# Patient Record
Sex: Female | Born: 1950 | Race: Black or African American | Hispanic: No | Marital: Married | Smoking: Never smoker
Health system: Southern US, Community
[De-identification: ages and names within clinical notes are randomized; demographics above are authoritative.]

## PROBLEM LIST (undated history)

## (undated) DIAGNOSIS — M2540 Effusion, unspecified joint: Secondary | ICD-10-CM

## (undated) DIAGNOSIS — M069 Rheumatoid arthritis, unspecified: Secondary | ICD-10-CM

## (undated) DIAGNOSIS — I10 Essential (primary) hypertension: Secondary | ICD-10-CM

## (undated) DIAGNOSIS — G8929 Other chronic pain: Secondary | ICD-10-CM

## (undated) DIAGNOSIS — M2550 Pain in unspecified joint: Secondary | ICD-10-CM

## (undated) DIAGNOSIS — M1990 Unspecified osteoarthritis, unspecified site: Secondary | ICD-10-CM

## (undated) DIAGNOSIS — M549 Dorsalgia, unspecified: Secondary | ICD-10-CM

## (undated) HISTORY — PX: HERNIA REPAIR: SHX51

## (undated) HISTORY — PX: ABDOMINAL HYSTERECTOMY: SHX81

## (undated) HISTORY — PX: JOINT REPLACEMENT: SHX530

## (undated) HISTORY — PX: COLONOSCOPY: SHX174

## (undated) HISTORY — PX: BACK SURGERY: SHX140

## (undated) HISTORY — PX: TONSILLECTOMY: SUR1361

---

## 2000-07-16 ENCOUNTER — Encounter: Admission: RE | Admit: 2000-07-16 | Discharge: 2000-07-16 | Payer: Self-pay | Admitting: Family Medicine

## 2000-07-16 ENCOUNTER — Encounter: Payer: Self-pay | Admitting: Family Medicine

## 2001-06-11 ENCOUNTER — Other Ambulatory Visit: Admission: RE | Admit: 2001-06-11 | Discharge: 2001-06-11 | Payer: Self-pay | Admitting: Family Medicine

## 2002-01-27 ENCOUNTER — Encounter: Admission: RE | Admit: 2002-01-27 | Discharge: 2002-01-27 | Payer: Self-pay | Admitting: Family Medicine

## 2002-01-27 ENCOUNTER — Encounter: Payer: Self-pay | Admitting: Family Medicine

## 2002-02-01 ENCOUNTER — Encounter: Payer: Self-pay | Admitting: Family Medicine

## 2002-02-01 ENCOUNTER — Encounter: Admission: RE | Admit: 2002-02-01 | Discharge: 2002-02-01 | Payer: Self-pay | Admitting: Family Medicine

## 2002-02-04 ENCOUNTER — Encounter: Payer: Self-pay | Admitting: Family Medicine

## 2002-02-04 ENCOUNTER — Encounter: Admission: RE | Admit: 2002-02-04 | Discharge: 2002-02-04 | Payer: Self-pay | Admitting: Family Medicine

## 2003-02-15 ENCOUNTER — Encounter: Admission: RE | Admit: 2003-02-15 | Discharge: 2003-02-15 | Payer: Self-pay | Admitting: Family Medicine

## 2003-02-15 ENCOUNTER — Encounter: Payer: Self-pay | Admitting: Family Medicine

## 2004-03-14 ENCOUNTER — Encounter: Admission: RE | Admit: 2004-03-14 | Discharge: 2004-03-14 | Payer: Self-pay | Admitting: Family Medicine

## 2004-07-18 ENCOUNTER — Ambulatory Visit (HOSPITAL_COMMUNITY): Admission: RE | Admit: 2004-07-18 | Discharge: 2004-07-18 | Payer: Self-pay | Admitting: Gastroenterology

## 2005-07-30 ENCOUNTER — Ambulatory Visit (HOSPITAL_COMMUNITY): Admission: RE | Admit: 2005-07-30 | Discharge: 2005-07-30 | Payer: Self-pay | Admitting: Family Medicine

## 2006-04-21 ENCOUNTER — Ambulatory Visit (HOSPITAL_BASED_OUTPATIENT_CLINIC_OR_DEPARTMENT_OTHER): Admission: RE | Admit: 2006-04-21 | Discharge: 2006-04-21 | Payer: Self-pay | Admitting: Orthopedic Surgery

## 2006-08-07 ENCOUNTER — Ambulatory Visit (HOSPITAL_COMMUNITY): Admission: RE | Admit: 2006-08-07 | Discharge: 2006-08-07 | Payer: Self-pay | Admitting: Family Medicine

## 2007-09-30 ENCOUNTER — Ambulatory Visit (HOSPITAL_COMMUNITY): Admission: RE | Admit: 2007-09-30 | Discharge: 2007-09-30 | Payer: Self-pay | Admitting: Family Medicine

## 2008-09-25 ENCOUNTER — Ambulatory Visit: Payer: Self-pay | Admitting: Internal Medicine

## 2008-10-03 LAB — CBC WITH DIFFERENTIAL/PLATELET
BASO%: 1.1 % (ref 0.0–2.0)
Basophils Absolute: 0 10*3/uL (ref 0.0–0.1)
EOS%: 0.8 % (ref 0.0–7.0)
HGB: 12.1 g/dL (ref 11.6–15.9)
MCH: 30.2 pg (ref 26.0–34.0)
MCHC: 33.9 g/dL (ref 32.0–36.0)
MCV: 89 fL (ref 81.0–101.0)
MONO%: 26.2 % — ABNORMAL HIGH (ref 0.0–13.0)
RBC: 4.03 10*6/uL (ref 3.70–5.32)
RDW: 13.7 % (ref 11.3–14.5)

## 2008-10-03 LAB — COMPREHENSIVE METABOLIC PANEL
AST: 22 U/L (ref 0–37)
Albumin: 4.3 g/dL (ref 3.5–5.2)
Alkaline Phosphatase: 78 U/L (ref 39–117)
BUN: 9 mg/dL (ref 6–23)
Potassium: 3.2 mEq/L — ABNORMAL LOW (ref 3.5–5.3)

## 2008-10-12 ENCOUNTER — Ambulatory Visit (HOSPITAL_COMMUNITY): Admission: RE | Admit: 2008-10-12 | Discharge: 2008-10-12 | Payer: Self-pay | Admitting: Family Medicine

## 2008-10-25 ENCOUNTER — Ambulatory Visit: Payer: Self-pay | Admitting: Internal Medicine

## 2008-10-27 ENCOUNTER — Encounter: Payer: Self-pay | Admitting: Internal Medicine

## 2008-10-27 ENCOUNTER — Ambulatory Visit (HOSPITAL_COMMUNITY): Admission: RE | Admit: 2008-10-27 | Discharge: 2008-10-27 | Payer: Self-pay | Admitting: Internal Medicine

## 2008-10-30 ENCOUNTER — Ambulatory Visit: Payer: Self-pay | Admitting: Internal Medicine

## 2009-07-25 ENCOUNTER — Emergency Department (HOSPITAL_COMMUNITY): Admission: EM | Admit: 2009-07-25 | Discharge: 2009-07-25 | Payer: Self-pay | Admitting: Emergency Medicine

## 2009-10-15 ENCOUNTER — Ambulatory Visit (HOSPITAL_COMMUNITY): Admission: RE | Admit: 2009-10-15 | Discharge: 2009-10-15 | Payer: Self-pay | Admitting: Family Medicine

## 2009-10-19 ENCOUNTER — Encounter: Admission: RE | Admit: 2009-10-19 | Discharge: 2009-10-19 | Payer: Self-pay | Admitting: Family Medicine

## 2010-10-21 ENCOUNTER — Ambulatory Visit (HOSPITAL_COMMUNITY)
Admission: RE | Admit: 2010-10-21 | Discharge: 2010-10-21 | Payer: Self-pay | Source: Home / Self Care | Attending: Family Medicine | Admitting: Family Medicine

## 2010-11-02 ENCOUNTER — Encounter: Payer: Self-pay | Admitting: Family Medicine

## 2010-11-03 ENCOUNTER — Encounter: Payer: Self-pay | Admitting: Family Medicine

## 2010-12-17 ENCOUNTER — Other Ambulatory Visit: Payer: Self-pay | Admitting: Rheumatology

## 2010-12-18 ENCOUNTER — Ambulatory Visit
Admission: RE | Admit: 2010-12-18 | Discharge: 2010-12-18 | Disposition: A | Discharge status: Home / Self Care | Payer: BC Managed Care – PPO | Source: Ambulatory Visit | Attending: Rheumatology | Admitting: Rheumatology

## 2011-01-27 LAB — CBC
MCHC: 33.1 g/dL (ref 30.0–36.0)
RDW: 13 % (ref 11.5–15.5)

## 2011-01-27 LAB — DIFFERENTIAL
Basophils Absolute: 0 10*3/uL (ref 0.0–0.1)
Eosinophils Absolute: 0 10*3/uL (ref 0.0–0.7)
Lymphocytes Relative: 42 % (ref 12–46)
Neutrophils Relative %: 38 % — ABNORMAL LOW (ref 43–77)

## 2011-01-27 LAB — BONE MARROW EXAM

## 2011-02-28 NOTE — Op Note (Signed)
NAMECHUDNEY, SCHEFFLER                ACCOUNT NO.:  [id number]   MEDICAL RECORD NO.:  [id number]          PATIENT TYPE:  AMB   LOCATION:  ENDO                         FACILITY:  [facility]   PHYSICIAN:  Danise Edge, M.D.   DATE OF BIRTH:  08-18-1951   DATE OF PROCEDURE:  07/18/2004  DATE OF DISCHARGE:                                 OPERATIVE REPORT   REFERRING PHYSICIAN:  Dr. Donia Guiles   PROCEDURE:  Screening colonoscopy.   PROCEDURE INDICATION:  Ms. Cassandra Case is a 60 year old female, born  [birth year].  Ms. Dacquisto is scheduled to undergo her first screening  colonoscopy with polypectomy to prevent colon cancer.   ENDOSCOPIST:  Danise Edge, M.D.   PREMEDICATION:  1.  Versed 6 mg.  2.  Demerol 60 mg.   DESCRIPTION OF PROCEDURE:  After obtaining informed consent, Ms. Fike was  placed in the left lateral decubitus position.  I administered intravenous  Demerol and intravenous Versed to achieve conscious sedation for the  procedure.  The patient's blood pressure, oxygen saturation, and cardiac  rhythm were monitored throughout the procedure and documented in the medical  record.   Anal inspection and digital rectal exam were normal.  The Olympus adjustable  pediatric colonoscope was introduced into the rectum and advanced to the  cecum.  Colonic preparation for the exam today was excellent.   RECTUM:  Normal.  SIGMOID COLON AND DESCENDING COLON:  Normal.  SPLENIC FLEXURE:  Normal.  TRANSVERSE COLON:  Normal.  HEPATIC FLEXURE:  Normal.  ASCENDING COLON:  Normal.  CECUM AND ILEOCECAL VALVE:  Normal.   ASSESSMENT:  Normal screening proctocolonoscopy to the cecum.      MJ/MEDQ  D:  07/18/2004  T:  07/18/2004  Job:  [id number]   cc:   Donia Guiles, M.D.  [address] [place]  [place] [address]  Fax: [phone]

## 2011-02-28 NOTE — Op Note (Signed)
NAMEANTHONIA, Cassandra Case                ACCOUNT NO.:  [id number]   MEDICAL RECORD NO.:  [id number]          PATIENT TYPE:  AMB   LOCATION:  DSC                          FACILITY:  [facility]   PHYSICIAN:  Katy Fitch. Sypher, M.D. DATE OF BIRTH:  1951-04-11   DATE OF PROCEDURE:  04/21/2006  DATE OF DISCHARGE:                                 OPERATIVE REPORT   PREOPERATIVE DIAGNOSIS:  Entrapment neuropathy, median nerve, right carpal  tunnel.   POSTOPERATIVE DIAGNOSIS:  Entrapment neuropathy, median nerve, right carpal  tunnel.   OPERATIONS:  Release of right transverse carpal ligament.   OPERATIONS:  Katy Fitch. Sypher, MD   ASSISTANT:  Annye Rusk PA-C.   ANESTHESIA:  General by LMA.   SUPERVISING ANESTHESIOLOGIST:  Dr. Gelene Mink.   INDICATIONS:  [name] is a 60 year old woman who is referred for  evaluation and management of an comfortable and numb right hand.  Clinical  examination revealed signs of carpal tunnel syndrome.  Electrodiagnostic  completed by Dr. __________ revealed moderately severe right carpal tunnel  syndrome.   Due to a failure to respond to nonoperative measures, she is brought to the  operating room at this time for release of her right tranverse carpal  ligament.   PROCEDURE:  [name] is brought to the operating room and placed in the  supine position on the operating table.   Following the induction of general anesthesia by LMA technique, the right  arm was prepped with Betadine soap solution and sterilely draped.  A  pneumatic tourniquet was applied to the proximal right brachium.   Following exsanguination of the right arm an Esmarch bandage, arterial  tourniquet was inflated to 220 mmHg.  The procedure commenced with short  incision in the line of the ring finger in the palm.  Subcutaneous tissues  were carefully divided revealing the palmar fascia.  This was split  longitudinally to reveal the common sensory branch the median nerve and the  superficial palmar arch.   The plane between the median nerve and flexor tendons and the tranverse  carpal ligament was developed with a Penfield 4 elevator followed by  scissors release of the tranverse carpal ligament along its ulnar border.  The volar carpal ligament was released subcutaneously into the distal  forearm.   This widely opened the carpal canal.  No mass or other predicaments were  noted.   Bleeding points along the margin of the loose ligament were  electrocauterized with bipolar current followed by repair of the skin with  intradermal 3-0 Prolene suture.   A compressive dressing was applied with a volar plaster splint keeping the  hand in 5 degrees of dorsiflexion.      Katy Fitch Sypher, M.D.  Electronically Signed     RVS/MEDQ  D:  04/21/2006  T:  04/21/2006  Job:  [id number]

## 2011-10-24 ENCOUNTER — Other Ambulatory Visit (HOSPITAL_COMMUNITY): Payer: Self-pay | Admitting: Family Medicine

## 2011-10-24 DIAGNOSIS — Z1231 Encounter for screening mammogram for malignant neoplasm of breast: Secondary | ICD-10-CM

## 2011-11-21 ENCOUNTER — Ambulatory Visit (HOSPITAL_COMMUNITY)
Admission: RE | Admit: 2011-11-21 | Discharge: 2011-11-21 | Disposition: A | Discharge status: Home / Self Care | Payer: BC Managed Care – PPO | Source: Ambulatory Visit | Attending: Family Medicine | Admitting: Family Medicine

## 2011-11-21 DIAGNOSIS — Z1231 Encounter for screening mammogram for malignant neoplasm of breast: Secondary | ICD-10-CM

## 2011-11-24 ENCOUNTER — Other Ambulatory Visit: Payer: Self-pay | Admitting: Rheumatology

## 2011-11-24 ENCOUNTER — Ambulatory Visit
Admission: RE | Admit: 2011-11-24 | Discharge: 2011-11-24 | Disposition: A | Discharge status: Home / Self Care | Payer: BC Managed Care – PPO | Source: Ambulatory Visit | Attending: Rheumatology | Admitting: Rheumatology

## 2011-11-24 DIAGNOSIS — R1030 Lower abdominal pain, unspecified: Secondary | ICD-10-CM

## 2012-01-02 ENCOUNTER — Encounter: Payer: Self-pay | Admitting: Family Medicine

## 2012-01-02 ENCOUNTER — Ambulatory Visit (INDEPENDENT_AMBULATORY_CARE_PROVIDER_SITE_OTHER): Payer: BC Managed Care – PPO | Admitting: Family Medicine

## 2012-01-02 VITALS — BP 122/69 | HR 72 | Temp 98.1°F | Resp 16 | Ht 64.75 in | Wt 150.8 lb

## 2012-01-02 DIAGNOSIS — J329 Chronic sinusitis, unspecified: Secondary | ICD-10-CM

## 2012-01-02 DIAGNOSIS — J019 Acute sinusitis, unspecified: Secondary | ICD-10-CM

## 2012-01-02 MED ORDER — AMOXICILLIN 875 MG PO TABS: 875.0000 mg | ORAL_TABLET | Freq: Two times a day (BID) | ORAL | Status: AC

## 2012-01-02 MED ORDER — HYDROCODONE-HOMATROPINE 5-1.5 MG/5ML PO SYRP: 5.0000 mL | ORAL_SOLUTION | Freq: Three times a day (TID) | ORAL | Status: AC | PRN

## 2012-01-02 NOTE — Progress Notes (Signed)
This is a 61 year old woman from the [place] whose married with grown children. He comes in with 1 week of progressive sinus congestion, myalgias, headache, and fatigue. She's also been coughing and she in general feels terrible. Her husband is also here with the same symptoms.  Objective: Patient appears acutely ill with no respiratory distress  HEENT: Unremarkable except for mucopurulent discharge from both nasal passages  Eyes: No icterus EOMs normal  Neck: Supple no adenopathy no thyromegaly  Chest: Rhonchi, scattered and clear with cough   Heart: Regular no murmur  Extremities: No edema  Skin: Warm and dry  assessment: Acute sinusitis, worsening  Plan he is amoxicillin and Hydromet. Patient to call if not better in 48 hours

## 2012-01-02 NOTE — Patient Instructions (Signed)

## 2012-02-13 ENCOUNTER — Other Ambulatory Visit: Payer: Self-pay | Admitting: Rheumatology

## 2012-02-13 DIAGNOSIS — M545 Low back pain: Secondary | ICD-10-CM

## 2012-02-19 ENCOUNTER — Ambulatory Visit
Admission: RE | Admit: 2012-02-19 | Discharge: 2012-02-19 | Disposition: A | Discharge status: Home / Self Care | Payer: BC Managed Care – PPO | Source: Ambulatory Visit | Attending: Rheumatology | Admitting: Rheumatology

## 2012-02-19 DIAGNOSIS — M545 Low back pain: Secondary | ICD-10-CM

## 2012-02-24 ENCOUNTER — Other Ambulatory Visit: Payer: Self-pay | Admitting: Rheumatology

## 2012-02-24 DIAGNOSIS — M5410 Radiculopathy, site unspecified: Secondary | ICD-10-CM

## 2012-03-04 ENCOUNTER — Ambulatory Visit
Admission: RE | Admit: 2012-03-04 | Discharge: 2012-03-04 | Disposition: A | Discharge status: Home / Self Care | Payer: BC Managed Care – PPO | Source: Ambulatory Visit | Attending: Rheumatology | Admitting: Rheumatology

## 2012-03-04 VITALS — BP 158/78 | HR 66

## 2012-03-04 DIAGNOSIS — M5410 Radiculopathy, site unspecified: Secondary | ICD-10-CM

## 2012-03-04 MED ORDER — METHYLPREDNISOLONE ACETATE 40 MG/ML INJ SUSP (RADIOLOG
120.0000 mg | Freq: Once | INTRAMUSCULAR | Status: AC
  Administered 2012-03-04: 120 mg via EPIDURAL

## 2012-03-04 MED ORDER — DIAZEPAM 5 MG PO TABS
5.0000 mg | ORAL_TABLET | Freq: Once | ORAL | Status: AC
  Administered 2012-03-04: 5 mg via ORAL

## 2012-03-04 MED ORDER — IOHEXOL 180 MG/ML  SOLN
1.0000 mL | Freq: Once | INTRAMUSCULAR | Status: AC | PRN
  Administered 2012-03-04: 1 mL via EPIDURAL

## 2012-03-04 NOTE — Progress Notes (Signed)
Pt very nervous about epidural steroid injection and valium 5 mg given. Pt is only on BP meds at home.

## 2012-03-04 NOTE — Discharge Instructions (Signed)

## 2012-04-28 ENCOUNTER — Other Ambulatory Visit: Payer: Self-pay | Admitting: Rheumatology

## 2012-04-28 DIAGNOSIS — M5410 Radiculopathy, site unspecified: Secondary | ICD-10-CM

## 2012-04-30 ENCOUNTER — Ambulatory Visit
Admission: RE | Admit: 2012-04-30 | Discharge: 2012-04-30 | Disposition: A | Discharge status: Home / Self Care | Payer: BC Managed Care – PPO | Source: Ambulatory Visit | Attending: Rheumatology | Admitting: Rheumatology

## 2012-04-30 VITALS — BP 132/65 | HR 68

## 2012-04-30 DIAGNOSIS — M5410 Radiculopathy, site unspecified: Secondary | ICD-10-CM

## 2012-04-30 MED ORDER — IOHEXOL 180 MG/ML  SOLN
1.0000 mL | Freq: Once | INTRAMUSCULAR | Status: AC | PRN
  Administered 2012-04-30: 1 mL via EPIDURAL

## 2012-04-30 MED ORDER — METHYLPREDNISOLONE ACETATE 40 MG/ML INJ SUSP (RADIOLOG
120.0000 mg | Freq: Once | INTRAMUSCULAR | Status: AC
  Administered 2012-04-30: 120 mg via EPIDURAL

## 2012-07-14 ENCOUNTER — Ambulatory Visit
Admission: RE | Admit: 2012-07-14 | Discharge: 2012-07-14 | Disposition: A | Discharge status: Home / Self Care | Payer: BC Managed Care – PPO | Source: Ambulatory Visit | Attending: Rheumatology | Admitting: Rheumatology

## 2012-07-14 ENCOUNTER — Other Ambulatory Visit: Payer: Self-pay | Admitting: Rheumatology

## 2012-07-14 DIAGNOSIS — R1030 Lower abdominal pain, unspecified: Secondary | ICD-10-CM

## 2012-07-14 DIAGNOSIS — M79659 Pain in unspecified thigh: Secondary | ICD-10-CM

## 2012-07-16 ENCOUNTER — Other Ambulatory Visit: Payer: Self-pay | Admitting: Rheumatology

## 2012-07-16 DIAGNOSIS — M25552 Pain in left hip: Secondary | ICD-10-CM

## 2012-07-16 DIAGNOSIS — M8700 Idiopathic aseptic necrosis of unspecified bone: Secondary | ICD-10-CM

## 2012-07-18 ENCOUNTER — Ambulatory Visit
Admission: RE | Admit: 2012-07-18 | Discharge: 2012-07-18 | Disposition: A | Discharge status: Home / Self Care | Payer: BC Managed Care – PPO | Source: Ambulatory Visit | Attending: Rheumatology | Admitting: Rheumatology

## 2012-07-18 DIAGNOSIS — M25552 Pain in left hip: Secondary | ICD-10-CM

## 2012-07-18 DIAGNOSIS — M8700 Idiopathic aseptic necrosis of unspecified bone: Secondary | ICD-10-CM

## 2012-07-28 ENCOUNTER — Other Ambulatory Visit: Payer: Self-pay | Admitting: Orthopaedic Surgery

## 2012-07-30 ENCOUNTER — Encounter (HOSPITAL_COMMUNITY): Payer: Self-pay | Admitting: Pharmacist

## 2012-08-05 ENCOUNTER — Encounter (HOSPITAL_COMMUNITY)
Admission: RE | Admit: 2012-08-05 | Discharge: 2012-08-05 | Disposition: A | Discharge status: Home / Self Care | Payer: BC Managed Care – PPO | Source: Ambulatory Visit | Attending: Orthopaedic Surgery | Admitting: Orthopaedic Surgery

## 2012-08-05 ENCOUNTER — Encounter (HOSPITAL_COMMUNITY): Payer: Self-pay

## 2012-08-05 HISTORY — DX: Effusion, unspecified joint: M25.40

## 2012-08-05 HISTORY — DX: Pain in unspecified joint: M25.50

## 2012-08-05 HISTORY — DX: Other chronic pain: G89.29

## 2012-08-05 HISTORY — DX: Essential (primary) hypertension: I10

## 2012-08-05 HISTORY — DX: Rheumatoid arthritis, unspecified: M06.9

## 2012-08-05 HISTORY — DX: Unspecified osteoarthritis, unspecified site: M19.90

## 2012-08-05 HISTORY — DX: Dorsalgia, unspecified: M54.9

## 2012-08-05 LAB — BASIC METABOLIC PANEL
Chloride: 98 mEq/L (ref 96–112)
Creatinine, Ser: 0.88 mg/dL (ref 0.50–1.10)
GFR calc Af Amer: 82 mL/min — ABNORMAL LOW (ref >90)
Potassium: 3 mEq/L — ABNORMAL LOW (ref 3.5–5.1)

## 2012-08-05 LAB — URINE MICROSCOPIC-ADD ON

## 2012-08-05 LAB — URINALYSIS, ROUTINE W REFLEX MICROSCOPIC
Bilirubin Urine: NEGATIVE
Hgb urine dipstick: NEGATIVE
Ketones, ur: NEGATIVE mg/dL
Protein, ur: NEGATIVE mg/dL
Urobilinogen, UA: 1 mg/dL (ref 0.0–1.0)

## 2012-08-05 LAB — PROTIME-INR
INR: 1 (ref 0.00–1.49)
Prothrombin Time: 13.1 seconds (ref 11.6–15.2)

## 2012-08-05 LAB — CBC WITH DIFFERENTIAL/PLATELET
Basophils Absolute: 0 10*3/uL (ref 0.0–0.1)
HCT: 35.4 % — ABNORMAL LOW (ref 36.0–46.0)
Lymphocytes Relative: 48 % — ABNORMAL HIGH (ref 12–46)
Monocytes Absolute: 0.4 10*3/uL (ref 0.1–1.0)
Neutro Abs: 1.4 10*3/uL — ABNORMAL LOW (ref 1.7–7.7)
Neutrophils Relative %: 37 % — ABNORMAL LOW (ref 43–77)
RDW: 14 % (ref 11.5–15.5)
WBC: 3.6 10*3/uL — ABNORMAL LOW (ref 4.0–10.5)

## 2012-08-05 LAB — TYPE AND SCREEN
ABO/RH(D): O POS
Antibody Screen: NEGATIVE

## 2012-08-05 LAB — APTT: aPTT: 30 seconds (ref 24–37)

## 2012-08-05 MED ORDER — CHLORHEXIDINE GLUCONATE 4 % EX LIQD: 60.0000 mL | Freq: Once | CUTANEOUS | Status: DC

## 2012-08-05 NOTE — Progress Notes (Signed)
Pt doesn't have a cardiologist  Denies ever having an echo/heart cath/stress test   Dr.Kimberlee Clelia Croft with [facility] is Medical MD  EKG to be requested from [facility]  Denies having a cxr in the past yr

## 2012-08-05 NOTE — Pre-Procedure Instructions (Signed)
20 Cassandra Case  08/05/2012   Your procedure is scheduled on:  Thurs, Oct 31 @ 10:45 AM  Report to [facility] at 8:45 AM.  Call this number if you have problems the morning of surgery: [phone]   Remember:   Do not eat food:After Midnight.    Take these medicines the morning of surgery with A SIP OF WATER: Pain Pill(if needed)   Do not wear jewelry, make-up or nail polish.  Do not wear lotions, powders, or perfumes. You may wear deodorant.  Do not shave 48 hours prior to surgery.   Do not bring valuables to the hospital.  Contacts, dentures or bridgework may not be worn into surgery.  Leave suitcase in the car. After surgery it may be brought to your room.  For patients admitted to the hospital, checkout time is 11:00 AM the day of discharge.   Patients discharged the day of surgery will not be allowed to drive home.    Special Instructions: Incentive Spirometry - Practice and bring it with you on the day of surgery. Shower using CHG 2 nights before surgery and the night before surgery.  If you shower the day of surgery use CHG.  Use special wash - you have one bottle of CHG for all showers.  You should use approximately 1/3 of the bottle for each shower.   Please read over the following fact sheets that you were given: Pain Booklet, Coughing and Deep Breathing, Blood Transfusion Information, Total Joint Packet, MRSA Information and Surgical Site Infection Prevention

## 2012-08-06 NOTE — H&P (Signed)
TOTAL HIP ADMISSION H&P  Patient is admitted for right total hip arthroplasty.  Subjective:  Chief Complaint: right hip pain  HPI: Cassandra Case, 61 y.o. female, has a history of pain and functional disability in the right hip(s) due to arthritis and patient has failed non-surgical conservative treatments for greater than 12 weeks to include NSAID's and/or analgesics, use of assistive devices and activity modification.  Onset of symptoms was gradual starting 1 years ago with gradually worsening course since that time.The patient noted no past surgery on the right hip(s).  Patient currently rates pain in the right hip at 9 out of 10 with activity. Patient has night pain, worsening of pain with activity and weight bearing, trendelenberg gait, pain that interfers with activities of daily living, pain with passive range of motion and crepitus. Patient has evidence of subchondral cysts, subchondral sclerosis, periarticular osteophytes and joint space narrowing by imaging studies. This condition presents safety issues increasing the risk of falls. This patient has had recent back surgery but no hip surgery.  There is no current active infection.  There are no active problems to display for this patient.  Past Medical History  Diagnosis Date  . Hypertension     takes Maxzide daily  . Arthritis   . Joint pain   . Joint swelling   . Rheumatoid arthritis   . Chronic back pain     hx of buldging disc    Past Surgical History  Procedure Date  . Hernia repair     at age 42-umbilical  . Tonsillectomy     at age 29  . Abdominal hysterectomy   . Back surgery 1987/2013  . Colonoscopy     No prescriptions prior to admission   No Known Allergies  History  Substance Use Topics  . Smoking status: Never Smoker   . Smokeless tobacco: Not on file  . Alcohol Use: No    No family history on file.   Review of Systems  Constitutional: Negative.   HENT: Negative.   Eyes: Negative.   Respiratory:  Negative.   Cardiovascular: Negative.   Gastrointestinal: Negative.   Genitourinary: Negative.   Musculoskeletal: Positive for joint pain.  Skin: Negative.   Neurological: Negative.   Endo/Heme/Allergies: Negative.   Psychiatric/Behavioral: Negative.     Objective:  Physical Exam  Constitutional: She appears well-nourished.  HENT:  Head: Normocephalic.  Eyes: Pupils are equal, round, and reactive to light.  Neck: Normal range of motion.  Cardiovascular: Regular rhythm.   Respiratory: Breath sounds normal.  GI: Bowel sounds are normal.  Musculoskeletal:       Right hip exam: Walks with a marked altered gait.  Hip motion is painful right greater than left.  Leg lengths appear to be equal.  Sensory and motor function both sides normal.  Very limited rotation right side greater than left.  Rotation is extremely painful.  Neurological: She is alert.  Skin: Skin is warm.  Psychiatric: She has a normal mood and affect.    Vital signs in last 24 hours:    Labs:   Estimated Body mass index is 25.29 kg/(m^2) as calculated from the following:   Height as of 01/02/12: 5' 4.75"(1.645 m).   Weight as of 01/02/12: 150 lb 12.8 oz(68.402 kg).   Imaging Review Plain radiographs demonstrate severe degenerative joint disease of the right hip(s). The bone quality appears to be good for age and reported activity level.  Assessment/Plan:  End stage arthritis, right hip(s)  The patient  history, physical examination, clinical judgement of the provider and imaging studies are consistent with end stage degenerative joint disease of the right hip(s) and total hip arthroplasty is deemed medically necessary. The treatment options including medical management, injection therapy, arthroscopy and arthroplasty were discussed at length. The risks and benefits of total hip arthroplasty were presented and reviewed. The risks due to aseptic loosening, infection, stiffness, dislocation/subluxation,   thromboembolic complications and other imponderables were discussed.  The patient acknowledged the explanation, agreed to proceed with the plan and consent was signed. Patient is being admitted for inpatient treatment for surgery, pain control, PT, OT, prophylactic antibiotics, VTE prophylaxis, progressive ambulation and ADL's and discharge planning.The patient is planning to be discharged home with home health services

## 2012-08-06 NOTE — Consult Note (Signed)
Anesthesia chart review: Patient is a 61 year old female scheduled for anterior right hip arthroplasty by Dr. Jerl Santos on 08/12/2012.  History includes non-smoker, HTN, RA, hysterectomy, back surgery.  PCP is Dr. Lupita Raider.  Labs noted. K+ 3.0.  Cr 0.88.  H/H 11.9/35.4.  CXR on 08/05/12 showed no active disease.  Her EKG was requested from the [facility], but is still pending.  Shonna Chock, PA-C

## 2012-08-10 NOTE — Progress Notes (Addendum)
[facility] .  ..req ekg .I spoke  With [employer].  She will fax EKG if she finds one done within one year . [phone]. Per Herbert Seta the patient does not have an EKG  At the [facility].

## 2012-08-11 MED ORDER — LACTATED RINGERS IV SOLN: INTRAVENOUS | Status: DC

## 2012-08-11 MED ORDER — CEFAZOLIN SODIUM-DEXTROSE 2-3 GM-% IV SOLR
2.0000 g | INTRAVENOUS | Status: AC
  Administered 2012-08-12: 2 g via INTRAVENOUS

## 2012-08-12 ENCOUNTER — Inpatient Hospital Stay (HOSPITAL_COMMUNITY)
Admission: RE | Admit: 2012-08-12 | Discharge: 2012-08-15 | DRG: 818 | Disposition: A | Discharge status: Home Health | Payer: BC Managed Care – PPO | Source: Ambulatory Visit | Attending: Orthopaedic Surgery | Admitting: Orthopaedic Surgery

## 2012-08-12 ENCOUNTER — Inpatient Hospital Stay (HOSPITAL_COMMUNITY): Payer: BC Managed Care – PPO

## 2012-08-12 ENCOUNTER — Encounter (HOSPITAL_COMMUNITY): Payer: Self-pay | Admitting: Vascular Surgery

## 2012-08-12 ENCOUNTER — Inpatient Hospital Stay (HOSPITAL_COMMUNITY): Payer: BC Managed Care – PPO | Admitting: Vascular Surgery

## 2012-08-12 ENCOUNTER — Encounter (HOSPITAL_COMMUNITY)
Admission: RE | Disposition: A | Discharge status: Home Health | Payer: Self-pay | Source: Ambulatory Visit | Attending: Orthopaedic Surgery

## 2012-08-12 ENCOUNTER — Encounter (HOSPITAL_COMMUNITY): Payer: Self-pay | Admitting: *Deleted

## 2012-08-12 DIAGNOSIS — M549 Dorsalgia, unspecified: Secondary | ICD-10-CM | POA: Diagnosis present

## 2012-08-12 DIAGNOSIS — E876 Hypokalemia: Secondary | ICD-10-CM | POA: Diagnosis not present

## 2012-08-12 DIAGNOSIS — Z01818 Encounter for other preprocedural examination: Secondary | ICD-10-CM

## 2012-08-12 DIAGNOSIS — M1610 Unilateral primary osteoarthritis, unspecified hip: Principal | ICD-10-CM | POA: Diagnosis present

## 2012-08-12 DIAGNOSIS — G8929 Other chronic pain: Secondary | ICD-10-CM | POA: Diagnosis present

## 2012-08-12 DIAGNOSIS — I10 Essential (primary) hypertension: Secondary | ICD-10-CM | POA: Diagnosis present

## 2012-08-12 DIAGNOSIS — Z01812 Encounter for preprocedural laboratory examination: Secondary | ICD-10-CM

## 2012-08-12 DIAGNOSIS — M069 Rheumatoid arthritis, unspecified: Secondary | ICD-10-CM | POA: Diagnosis present

## 2012-08-12 DIAGNOSIS — M129 Arthropathy, unspecified: Secondary | ICD-10-CM | POA: Diagnosis present

## 2012-08-12 DIAGNOSIS — M169 Osteoarthritis of hip, unspecified: Principal | ICD-10-CM | POA: Diagnosis present

## 2012-08-12 HISTORY — PX: TOTAL HIP ARTHROPLASTY: SHX124

## 2012-08-12 SURGERY — ARTHROPLASTY, HIP, TOTAL, ANTERIOR APPROACH
Anesthesia: General | Site: Hip | Laterality: Right | Wound class: Clean

## 2012-08-12 MED ORDER — FENTANYL CITRATE 0.05 MG/ML IJ SOLN
INTRAMUSCULAR | Status: DC | PRN
  Administered 2012-08-12: 200 ug via INTRAVENOUS
  Administered 2012-08-12 (×4): 50 ug via INTRAVENOUS

## 2012-08-12 MED ORDER — SENNOSIDES-DOCUSATE SODIUM 8.6-50 MG PO TABS: 1.0000 | ORAL_TABLET | Freq: Every evening | ORAL | Status: DC | PRN

## 2012-08-12 MED ORDER — 0.9 % SODIUM CHLORIDE (POUR BTL) OPTIME
TOPICAL | Status: DC | PRN
  Administered 2012-08-12: 1000 mL

## 2012-08-12 MED ORDER — METHOCARBAMOL 100 MG/ML IJ SOLN
500.0000 mg | Freq: Four times a day (QID) | INTRAVENOUS | Status: DC | PRN
  Administered 2012-08-12: 500 mg via INTRAVENOUS
  Filled 2012-08-12: qty 5

## 2012-08-12 MED ORDER — GLYCOPYRROLATE 0.2 MG/ML IJ SOLN
INTRAMUSCULAR | Status: DC | PRN
  Administered 2012-08-12: .8 mg via INTRAVENOUS

## 2012-08-12 MED ORDER — PROMETHAZINE HCL 25 MG/ML IJ SOLN: 6.2500 mg | INTRAMUSCULAR | Status: DC | PRN

## 2012-08-12 MED ORDER — HYDROMORPHONE HCL PF 1 MG/ML IJ SOLN
INTRAMUSCULAR | Status: AC
  Administered 2012-08-12: 0.25 mg via INTRAVENOUS
  Filled 2012-08-12: qty 1

## 2012-08-12 MED ORDER — ONDANSETRON HCL 4 MG/2ML IJ SOLN: 4.0000 mg | Freq: Four times a day (QID) | INTRAMUSCULAR | Status: DC | PRN

## 2012-08-12 MED ORDER — HYDROMORPHONE HCL PF 1 MG/ML IJ SOLN
0.5000 mg | INTRAMUSCULAR | Status: DC | PRN
  Administered 2012-08-12 – 2012-08-13 (×5): 1 mg via INTRAVENOUS
  Filled 2012-08-12 (×5): qty 1

## 2012-08-12 MED ORDER — PROPOFOL 10 MG/ML IV BOLUS
INTRAVENOUS | Status: DC | PRN
  Administered 2012-08-12: 120 mg via INTRAVENOUS

## 2012-08-12 MED ORDER — MEPERIDINE HCL 25 MG/ML IJ SOLN: 6.2500 mg | INTRAMUSCULAR | Status: DC | PRN

## 2012-08-12 MED ORDER — NEOSTIGMINE METHYLSULFATE 1 MG/ML IJ SOLN
INTRAMUSCULAR | Status: DC | PRN
  Administered 2012-08-12: 1 mg via INTRAVENOUS

## 2012-08-12 MED ORDER — MIDAZOLAM HCL 5 MG/5ML IJ SOLN
INTRAMUSCULAR | Status: DC | PRN
  Administered 2012-08-12 (×2): 1 mg via INTRAVENOUS

## 2012-08-12 MED ORDER — MENTHOL 3 MG MT LOZG: 1.0000 | LOZENGE | OROMUCOSAL | Status: DC | PRN

## 2012-08-12 MED ORDER — ACETAMINOPHEN 325 MG PO TABS: 650.0000 mg | ORAL_TABLET | Freq: Four times a day (QID) | ORAL | Status: DC | PRN

## 2012-08-12 MED ORDER — METOCLOPRAMIDE HCL 10 MG PO TABS: 5.0000 mg | ORAL_TABLET | Freq: Three times a day (TID) | ORAL | Status: DC | PRN

## 2012-08-12 MED ORDER — DOCUSATE SODIUM 100 MG PO CAPS
100.0000 mg | ORAL_CAPSULE | Freq: Two times a day (BID) | ORAL | Status: DC
  Administered 2012-08-12 – 2012-08-15 (×6): 100 mg via ORAL
  Filled 2012-08-12 (×6): qty 1

## 2012-08-12 MED ORDER — TRIAMTERENE-HCTZ 75-50 MG PO TABS
1.0000 | ORAL_TABLET | Freq: Every day | ORAL | Status: DC
  Administered 2012-08-13 – 2012-08-15 (×3): 1 via ORAL
  Filled 2012-08-12 (×4): qty 1

## 2012-08-12 MED ORDER — HYDROCODONE-ACETAMINOPHEN 7.5-325 MG PO TABS
1.0000 | ORAL_TABLET | ORAL | Status: DC | PRN
  Administered 2012-08-13 – 2012-08-15 (×11): 2 via ORAL
  Filled 2012-08-12 (×11): qty 2

## 2012-08-12 MED ORDER — ESMOLOL HCL 10 MG/ML IV SOLN
INTRAVENOUS | Status: DC | PRN
  Administered 2012-08-12: 40 mg via INTRAVENOUS
  Administered 2012-08-12: 60 mg via INTRAVENOUS

## 2012-08-12 MED ORDER — ARTIFICIAL TEARS OP OINT
TOPICAL_OINTMENT | OPHTHALMIC | Status: DC | PRN
  Administered 2012-08-12: 1 via OPHTHALMIC

## 2012-08-12 MED ORDER — ALUM & MAG HYDROXIDE-SIMETH 200-200-20 MG/5ML PO SUSP: 30.0000 mL | ORAL | Status: DC | PRN

## 2012-08-12 MED ORDER — MAGNESIUM CITRATE PO SOLN
1.0000 | Freq: Once | ORAL | Status: AC | PRN
  Filled 2012-08-12: qty 296

## 2012-08-12 MED ORDER — ROCURONIUM BROMIDE 100 MG/10ML IV SOLN
INTRAVENOUS | Status: DC | PRN
  Administered 2012-08-12: 50 mg via INTRAVENOUS

## 2012-08-12 MED ORDER — ACETAMINOPHEN 650 MG RE SUPP: 650.0000 mg | Freq: Four times a day (QID) | RECTAL | Status: DC | PRN

## 2012-08-12 MED ORDER — OXYCODONE HCL 5 MG PO TABS: 5.0000 mg | ORAL_TABLET | Freq: Once | ORAL | Status: DC | PRN

## 2012-08-12 MED ORDER — METOCLOPRAMIDE HCL 5 MG/ML IJ SOLN: 5.0000 mg | Freq: Three times a day (TID) | INTRAMUSCULAR | Status: DC | PRN

## 2012-08-12 MED ORDER — LIDOCAINE HCL (CARDIAC) 20 MG/ML IV SOLN
INTRAVENOUS | Status: DC | PRN
  Administered 2012-08-12: 30 mg via INTRAVENOUS

## 2012-08-12 MED ORDER — HYDROMORPHONE HCL PF 1 MG/ML IJ SOLN
0.2500 mg | INTRAMUSCULAR | Status: DC | PRN
  Administered 2012-08-12 (×3): 0.25 mg via INTRAVENOUS

## 2012-08-12 MED ORDER — ONDANSETRON HCL 4 MG/2ML IJ SOLN
INTRAMUSCULAR | Status: DC | PRN
  Administered 2012-08-12: 4 mg via INTRAVENOUS

## 2012-08-12 MED ORDER — CEFAZOLIN SODIUM-DEXTROSE 2-3 GM-% IV SOLR
2.0000 g | Freq: Four times a day (QID) | INTRAVENOUS | Status: AC
  Administered 2012-08-12 – 2012-08-13 (×2): 2 g via INTRAVENOUS
  Filled 2012-08-12 (×3): qty 50

## 2012-08-12 MED ORDER — ZOLPIDEM TARTRATE 5 MG PO TABS
5.0000 mg | ORAL_TABLET | Freq: Every evening | ORAL | Status: DC | PRN
  Administered 2012-08-13: 5 mg via ORAL
  Filled 2012-08-12: qty 1

## 2012-08-12 MED ORDER — ONDANSETRON HCL 4 MG PO TABS: 4.0000 mg | ORAL_TABLET | Freq: Four times a day (QID) | ORAL | Status: DC | PRN

## 2012-08-12 MED ORDER — LACTATED RINGERS IV SOLN
INTRAVENOUS | Status: DC | PRN
  Administered 2012-08-12: 1000 mL
  Administered 2012-08-12: 10:00:00 via INTRAVENOUS

## 2012-08-12 MED ORDER — MIDAZOLAM HCL 2 MG/2ML IJ SOLN: 0.5000 mg | Freq: Once | INTRAMUSCULAR | Status: DC | PRN

## 2012-08-12 MED ORDER — DEXTROSE-NACL 5-0.2 % IV SOLN
INTRAVENOUS | Status: DC
  Administered 2012-08-12: 19:00:00 via INTRAVENOUS
  Filled 2012-08-12: qty 1000

## 2012-08-12 MED ORDER — PHENOL 1.4 % MT LIQD: 1.0000 | OROMUCOSAL | Status: DC | PRN

## 2012-08-12 MED ORDER — METHOCARBAMOL 500 MG PO TABS
500.0000 mg | ORAL_TABLET | Freq: Four times a day (QID) | ORAL | Status: DC | PRN
  Administered 2012-08-13 – 2012-08-15 (×4): 500 mg via ORAL
  Filled 2012-08-12 (×4): qty 1

## 2012-08-12 MED ORDER — ASPIRIN EC 325 MG PO TBEC
325.0000 mg | DELAYED_RELEASE_TABLET | Freq: Two times a day (BID) | ORAL | Status: DC
  Administered 2012-08-13 – 2012-08-15 (×5): 325 mg via ORAL
  Filled 2012-08-12 (×7): qty 1

## 2012-08-12 MED ORDER — OXYCODONE HCL 5 MG/5ML PO SOLN: 5.0000 mg | Freq: Once | ORAL | Status: DC | PRN

## 2012-08-12 SURGICAL SUPPLY — 52 items
BLADE SAW SGTL 18X1.27X75 (BLADE) ×2 IMPLANT
BLADE SURG ROTATE 9660 (MISCELLANEOUS) IMPLANT
CELLS DAT CNTRL 66122 CELL SVR (MISCELLANEOUS) ×1 IMPLANT
CLOTH BEACON ORANGE TIMEOUT ST (SAFETY) ×2 IMPLANT
COVER BACK TABLE 24X17X13 BIG (DRAPES) IMPLANT
COVER SURGICAL LIGHT HANDLE (MISCELLANEOUS) ×2 IMPLANT
DRAPE C-ARM 42X72 X-RAY (DRAPES) ×2 IMPLANT
DRAPE STERI IOBAN 125X83 (DRAPES) ×2 IMPLANT
DRAPE U-SHAPE 47X51 STRL (DRAPES) ×6 IMPLANT
DRSG MEPILEX BORDER 4X12 (GAUZE/BANDAGES/DRESSINGS) IMPLANT
DRSG MEPILEX BORDER 4X8 (GAUZE/BANDAGES/DRESSINGS) ×2 IMPLANT
DURAPREP 26ML APPLICATOR (WOUND CARE) ×2 IMPLANT
ELECT BLADE 4.0 EZ CLEAN MEGAD (MISCELLANEOUS)
ELECT BLADE TIP CTD 4 INCH (ELECTRODE) ×2 IMPLANT
ELECT CAUTERY BLADE 6.4 (BLADE) ×2 IMPLANT
ELECT REM PT RETURN 9FT ADLT (ELECTROSURGICAL) ×2
ELECTRODE BLDE 4.0 EZ CLN MEGD (MISCELLANEOUS) IMPLANT
ELECTRODE REM PT RTRN 9FT ADLT (ELECTROSURGICAL) ×1 IMPLANT
FACESHIELD LNG OPTICON STERILE (SAFETY) ×4 IMPLANT
GAUZE XEROFORM 1X8 LF (GAUZE/BANDAGES/DRESSINGS) ×2 IMPLANT
GLOVE BIO SURGEON STRL SZ8 (GLOVE) ×2 IMPLANT
GLOVE BIO SURGEON STRL SZ8.5 (GLOVE) ×2 IMPLANT
GLOVE BIOGEL PI IND STRL 8 (GLOVE) ×1 IMPLANT
GLOVE BIOGEL PI IND STRL 8.5 (GLOVE) ×1 IMPLANT
GLOVE BIOGEL PI INDICATOR 8 (GLOVE) ×1
GLOVE BIOGEL PI INDICATOR 8.5 (GLOVE) ×1
GOWN PREVENTION PLUS LG XLONG (DISPOSABLE) IMPLANT
GOWN STRL NON-REIN LRG LVL3 (GOWN DISPOSABLE) ×4 IMPLANT
GOWN STRL REIN XL XLG (GOWN DISPOSABLE) ×2 IMPLANT
KIT BASIN OR (CUSTOM PROCEDURE TRAY) ×2 IMPLANT
KIT ROOM TURNOVER OR (KITS) ×2 IMPLANT
MANIFOLD NEPTUNE II (INSTRUMENTS) ×2 IMPLANT
NS IRRIG 1000ML POUR BTL (IV SOLUTION) ×2 IMPLANT
PACK TOTAL JOINT (CUSTOM PROCEDURE TRAY) ×2 IMPLANT
PAD ARMBOARD 7.5X6 YLW CONV (MISCELLANEOUS) ×4 IMPLANT
RETRACTOR WND ALEXIS 18 MED (MISCELLANEOUS) ×1 IMPLANT
RTRCTR WOUND ALEXIS 18CM MED (MISCELLANEOUS) ×2
SPONGE LAP 18X18 X RAY DECT (DISPOSABLE) ×2 IMPLANT
SPONGE LAP 4X18 X RAY DECT (DISPOSABLE) IMPLANT
STAPLER VISISTAT 35W (STAPLE) ×2 IMPLANT
SUT ETHIBOND NAB CT1 #1 30IN (SUTURE) ×4 IMPLANT
SUT VIC AB 0 CT1 27 (SUTURE)
SUT VIC AB 0 CT1 27XBRD ANBCTR (SUTURE) IMPLANT
SUT VIC AB 1 CT1 27 (SUTURE) ×2
SUT VIC AB 1 CT1 27XBRD ANBCTR (SUTURE) ×1 IMPLANT
SUT VIC AB 2-0 CT1 27 (SUTURE) ×2
SUT VIC AB 2-0 CT1 TAPERPNT 27 (SUTURE) ×1 IMPLANT
SUT VLOC 180 0 24IN GS25 (SUTURE) ×2 IMPLANT
TOWEL OR 17X24 6PK STRL BLUE (TOWEL DISPOSABLE) ×2 IMPLANT
TOWEL OR 17X26 10 PK STRL BLUE (TOWEL DISPOSABLE) ×4 IMPLANT
TRAY FOLEY CATH 14FR (SET/KITS/TRAYS/PACK) IMPLANT
WATER STERILE IRR 1000ML POUR (IV SOLUTION) ×4 IMPLANT

## 2012-08-12 NOTE — Anesthesia Preprocedure Evaluation (Signed)
Anesthesia Evaluation  Patient identified by MRN, date of birth, ID band Patient awake    Reviewed: Allergy & Precautions, H&P , NPO status , Patient's Chart, lab work & pertinent test results  History of Anesthesia Complications Negative for: history of anesthetic complications  Airway Mallampati: I TM Distance: >3 FB Neck ROM: Full    Dental  (+) Edentulous Upper and Edentulous Lower   Pulmonary former smoker (remote smoker),  breath sounds clear to auscultation  Pulmonary exam normal       Cardiovascular hypertension, Pt. on medications Rhythm:Regular Rate:Normal     Neuro/Psych Chronic back pain negative neurological ROS  negative psych ROS   GI/Hepatic negative GI ROS, Neg liver ROS,   Endo/Other  negative endocrine ROS  Renal/GU negative Renal ROS     Musculoskeletal  (+) Arthritis - (no steroids in months), Rheumatoid disorders,    Abdominal   Peds  Hematology negative hematology ROS (+)   Anesthesia Other Findings   Reproductive/Obstetrics                           Anesthesia Physical Anesthesia Plan  ASA: II  Anesthesia Plan: General   Post-op Pain Management:    Induction: Intravenous  Airway Management Planned: Oral ETT  Additional Equipment:   Intra-op Plan:   Post-operative Plan: Extubation in OR  Informed Consent: I have reviewed the patients History and Physical, chart, labs and discussed the procedure including the risks, benefits and alternatives for the proposed anesthesia with the patient or authorized representative who has indicated his/her understanding and acceptance.   Dental advisory given  Plan Discussed with: CRNA and Surgeon  Anesthesia Plan Comments: (Plan routine monitors, GETA)        Anesthesia Quick Evaluation

## 2012-08-12 NOTE — Op Note (Signed)
PRE-OP DIAGNOSIS:  RIGHT HIP DEGENERATIVE JOINT DISEASE POST-OP DIAGNOSIS:  RIGHT HIP DEGENERATIVE JOINT DISEASE PROCEDURE:  Procedure(s): RIGHT TOTAL HIP ARTHROPLASTY ANTERIOR APPROACH ANESTHESIA:  General SURGEON:  Marcene Corning MD ASSISTANT:  Lindwood Qua PA-C   INDICATIONS FOR PROCEDURE:  The patient is a 61 y.o. female with a long history of a painful hip.  This has persisted despite multiple conservative measures.  The patient has persisted with pain and dysfunction making rest and activity difficult.  A total hip replacement is offered as surgical treatment.  Informed operative consent was obtained after discussion of possible complications including reaction to anesthesia, infection, neurovascular injury, dislocation, DVT, PE, and death.  The importance of the postoperative rehab program to optimize result was stressed with the patient.  SUMMARY OF FINDINGS AND PROCEDURE:  Under general anesthesia through a anterior approach an the Hana table a right THR was performed.  The patient had severe degenerative change and good bone quality.  We used DePuy components to replace the hip and these were size KA11 Corail femur capped with a 32 +1 ceramic hip ball.  On the acetabular side we used a size 48 Gription shell with a  plus 4 neutral polyethylene liner.  We did use a hole eliminator.  Bryna Colander assisted throughout and was invaluable to the completion of the case in that he helped position and retract while I performed the procedure.  He also closed simultaneously to help minimize OR time.  I used fluoroscopy throughout the case to check position of implants and leg lengths and read these views myself.  DESCRIPTION OF PROCEDURE:  The patient was taken to the OR suite where general anesthetic was applied.  The patient was then positioned on the Hana table supine.  All bony prominences were appropriately padded.  Prep and drape was then performed in normal sterile fashion.  The patient was  given Kefzol preoperative antibiotic and an appropriate time out was performed.  We then took an anterior approach to the right hip.  Dissection was taken through adipose to the tensor fascia lata fascia.  This structure was incised longitudinally and we dissected in the intermuscular interval just medial to this muscle.  Cobra retractors were placed superior and inferior to the femoral neck superficial to the capsule.  A capsular incision was then made and the retractors were placed along the femoral neck.  Xray was brought in to get a good level for the femoral neck cut which was made with an oscillating saw and osteotome.  The femoral head was removed with a corkscrew.  The acetabulum was exposed and some labral tissues were excised. Reaming was taken to the inside wall of the pelvis and sequentially up to 1 mm smaller than the actual component.  A trial of components was done and then the aforementioned acetabular shell was placed in appropriate tilt and anteversion confirmed by fluoroscopy. The liner was placed along with the hole eliminator and attention was turned to the femur.  The leg was brought down and over into adduction and the elevator bar was used to raise the femur up gently in the wound.  The piriformis was released with care taken to preserve the obturator internus attachment and all of the posterior capsule. The femur was reamed and then broached to the appropriate size.  A trial reduction was done and the aforementioned head and neck assembly gave [place] the best stability in extension with external rotation.  Leg lengths were felt to be about equal by  fluoroscopic exam.  The trial components were removed and the wound irrigated.  We then placed the femoral component in appropriate anteversion.  The head was applied to a dry stem neck and the hip again reduced.  It was again stable in the aforementioned position.  The would was irrigated again followed by re-approximation of anterior capsule with  ethibond suture. Tensor fascia was repaired with V-loc suture  followed by subcutaneous closure with #O and #2 undyed vicryl.  Skin was closed with staples followed by a sterile dressing.  EBL and IOF can be obtained from anesthesia records.  DISPOSITION:  The patient was extubated in the OR and taken to PACU in stable condition to be admitted to the Orthopedic Surgery for appropriate post-op care to include perioperative antibiotics and DVT prophylaxis.

## 2012-08-12 NOTE — Plan of Care (Signed)
Problem: Consults Goal: Diagnosis- Total Joint Replacement Primary Total Hip Right     

## 2012-08-12 NOTE — Anesthesia Procedure Notes (Signed)
Procedure Name: Intubation Date/Time: 08/12/2012 11:48 AM Performed by: Gayla Medicus Pre-anesthesia Checklist: Patient identified, Timeout performed, Emergency Drugs available, Suction available and Patient being monitored Patient Re-evaluated:Patient Re-evaluated prior to inductionOxygen Delivery Method: Circle system utilized Preoxygenation: Pre-oxygenation with 100% oxygen Intubation Type: IV induction Ventilation: Mask ventilation without difficulty Laryngoscope Size: Mac and 3 Grade View: Grade I Tube type: Oral Tube size: 7.5 mm Number of attempts: 1 Airway Equipment and Method: Stylet Placement Confirmation: ETT inserted through vocal cords under direct vision,  positive ETCO2 and breath sounds checked- equal and bilateral Secured at: 23 cm Tube secured with: Tape Dental Injury: Teeth and Oropharynx as per pre-operative assessment

## 2012-08-12 NOTE — Preoperative (Signed)
Beta Blockers   Reason not to administer Beta Blockers: not prescribed 

## 2012-08-12 NOTE — Transfer of Care (Signed)
Immediate Anesthesia Transfer of Care Note  Patient: Cassandra Case  Procedure(s) Performed: Procedure(s) (LRB) with comments: TOTAL HIP ARTHROPLASTY ANTERIOR APPROACH (Right)  Patient Location: PACU  Anesthesia Type:General  Level of Consciousness: awake, alert  and oriented  Airway & Oxygen Therapy: Patient Spontanous Breathing and Patient connected to nasal cannula oxygen  Post-op Assessment: Report given to PACU RN, Post -op Vital signs reviewed and stable and Patient moving all extremities X 4  Post vital signs: Reviewed and stable  Complications: No apparent anesthesia complications

## 2012-08-12 NOTE — Anesthesia Postprocedure Evaluation (Signed)
  Anesthesia Post-op Note  Patient: Cassandra Case  Procedure(s) Performed: Procedure(s) (LRB) with comments: TOTAL HIP ARTHROPLASTY ANTERIOR APPROACH (Right)  Patient Location: PACU  Anesthesia Type:General  Level of Consciousness: sedated, patient cooperative and responds to stimulation and voice  Airway and Oxygen Therapy: Patient Spontanous Breathing  Post-op Pain: none  Post-op Assessment: Post-op Vital signs reviewed, Patient's Cardiovascular Status Stable, Respiratory Function Stable, Patent Airway, No signs of Nausea or vomiting and Pain level controlled  Post-op Vital Signs: Reviewed and stable  Complications: No apparent anesthesia complications

## 2012-08-12 NOTE — Interval H&P Note (Signed)
History and Physical Interval Note:  08/12/2012 11:31 AM  Cassandra Case  has presented today for surgery, with the diagnosis of RIGHT HIP AVASCULAR NECROSIS  The various methods of treatment have been discussed with the patient and family. After consideration of risks, benefits and other options for treatment, the patient has consented to  Procedure(s) (LRB) with comments: TOTAL HIP ARTHROPLASTY ANTERIOR APPROACH (Right) as a surgical intervention .  The patient's history has been reviewed, patient examined, no change in status, stable for surgery.  I have reviewed the patient's chart and labs.  Questions were answered to the patient's satisfaction.     Naszir Cott G

## 2012-08-13 ENCOUNTER — Encounter (HOSPITAL_COMMUNITY): Payer: Self-pay | Admitting: Orthopaedic Surgery

## 2012-08-13 LAB — BASIC METABOLIC PANEL
BUN: 7 mg/dL (ref 6–23)
Calcium: 9.3 mg/dL (ref 8.4–10.5)
Chloride: 96 mEq/L (ref 96–112)
Creatinine, Ser: 0.75 mg/dL (ref 0.50–1.10)
GFR calc Af Amer: >90 mL/min (ref >90)

## 2012-08-13 LAB — CBC
HCT: 27.3 % — ABNORMAL LOW (ref 36.0–46.0)
MCH: 29.3 pg (ref 26.0–34.0)
MCHC: 33.3 g/dL (ref 30.0–36.0)
MCV: 87.8 fL (ref 78.0–100.0)
Platelets: 252 10*3/uL (ref 150–400)
RDW: 14.3 % (ref 11.5–15.5)
WBC: 3.9 10*3/uL — ABNORMAL LOW (ref 4.0–10.5)

## 2012-08-13 NOTE — Evaluation (Signed)
Physical Therapy Evaluation Patient Details Name: Cassandra Case MRN: [id number] DOB: 1951-08-08 Today's Date: 08/13/2012 Time: 4098-1191 PT Time Calculation (min): 33 min  PT Assessment / Plan / Recommendation Clinical Impression  Pt is a pleasent and cooperative 61 y.o. female s/p Right THA total anterior approach.  Pt demonstrates some deficits in functional mobility secondaryt to pain, weakness and decreased activity tolerance. Pt will benefit from skilled PT to address these deficits and maximize independence  for d/c. Rec home PT upon discharge.    PT Assessment  Patient needs continued PT services    Follow Up Recommendations  Home health PT          Equipment Recommendations  None recommended by PT    Recommendations for Other Services     Frequency 7X/week    Precautions / Restrictions Precautions Precautions: None (total anterior no px) Restrictions Weight Bearing Restrictions: Yes RLE Weight Bearing: Weight bearing as tolerated   Pertinent Vitals/Pain Pt states no pain prior activity      Mobility  Bed Mobility Bed Mobility: Rolling Left;Left Sidelying to Sit;Sitting - Scoot to Edge of Bed Rolling Left: 5: Supervision Left Sidelying to Sit: 5: Supervision Sitting - Scoot to Edge of Bed: 5: Supervision Details for Bed Mobility Assistance: Pt able to self assist RLE  Transfers Transfers: Sit to Stand;Stand to Sit Sit to Stand: 4: Min assist;From bed;With upper extremity assist;4: Min guard;From chair/3-in-1;With armrests Stand to Sit: 4: Min guard;To chair/3-in-1;With armrests Details for Transfer Assistance: VC's for hand placement and upright posture Ambulation/Gait Ambulation/Gait Assistance: 4: Min guard Ambulation Distance (Feet): 24 Feet (to sink/to toilet/ in room ambulation) Assistive device: Rolling walker Ambulation/Gait Assistance Details: VC's for sequencing and upright posture Gait Pattern: Step-to pattern;Decreased stride length;Decreased  weight shift to right;Antalgic;Trunk flexed Gait velocity: decreased General Gait Details: pt reports minimal dizziness with initial ambulation; resolved quickly Stairs: No       Exercises Total Joint Exercises Ankle Circles/Pumps: AROM;Both;20 reps Long Arc Quad: AROM;10 reps;Right   PT Diagnosis: Difficulty walking;Abnormality of gait;Generalized weakness;Acute pain  PT Problem List: Decreased strength;Decreased range of motion;Decreased activity tolerance;Decreased mobility;Decreased knowledge of use of DME;Pain PT Treatment Interventions: DME instruction;Stair training;Gait training;Functional mobility training;Therapeutic activities;Therapeutic exercise;Patient/family education   PT Goals Acute Rehab PT Goals PT Goal Formulation: With patient Time For Goal Achievement: 08/13/12 Potential to Achieve Goals: Good Pt will Stand: with modified independence PT Goal: Stand - Progress: Goal set today Pt will Ambulate: >150 feet;with modified independence;with rolling walker PT Goal: Ambulate - Progress: Goal set today Pt will Go Up / Down Stairs: 3-5 stairs;with least restrictive assistive device;with min assist PT Goal: Up/Down Stairs - Progress: Goal set today Pt will Perform Home Exercise Program: Independently PT Goal: Perform Home Exercise Program - Progress: Goal set today  Visit Information  Last PT Received On: 08/13/12 Assistance Needed: +1    Subjective Data  Subjective: no in any pain right now Patient Stated Goal: to go home   Prior Functioning  Home Living Lives With: Spouse Available Help at Discharge: Family Type of Home: House Home Access: Stairs to enter Secretary/administrator of Steps: 2 Entrance Stairs-Rails: None Home Layout: Two level;Able to live on main level with bedroom/bathroom Bathroom Toilet: Standard Bathroom Accessibility: No Home Adaptive Equipment: Bedside commode/3-in-1;Straight cane;Shower chair with back;Walker - rolling Prior  Function Level of Independence: Independent with assistive device(s) (cane) Able to Take Stairs?: Yes (using cane) Driving: Yes Vocation: Full time employment Comments: standing a lot at work Musician:  (  glasses) Dominant Hand: Right    Cognition  Overall Cognitive Status: Appears within functional limits for tasks assessed/performed Arousal/Alertness: Awake/alert Orientation Level: Appears intact for tasks assessed;Oriented X4 / Intact Behavior During Session: [facility] for tasks performed    Extremity/Trunk Assessment Right Upper Extremity Assessment RUE ROM/Strength/Tone: [facility] for tasks assessed Left Upper Extremity Assessment LUE ROM/Strength/Tone: [facility] for tasks assessed Right Lower Extremity Assessment RLE ROM/Strength/Tone: Deficits;Unable to fully assess;Due to pain;Due to precautions Left Lower Extremity Assessment LLE ROM/Strength/Tone: [facility] for tasks assessed;Deficits;Due to pain Trunk Assessment Trunk Assessment: Normal   Balance Balance Balance Assessed: Yes High Level Balance High Level Balance Activites: Side stepping;Backward walking;Turns;Direction changes;Head turns High Level Balance Comments: Pt able to navigate balance activities with min guard  End of Session PT - End of Session Equipment Utilized During Treatment: Gait belt Activity Tolerance: Patient tolerated treatment well;Patient limited by pain Patient left: in chair;with call bell/phone within reach Nurse Communication: Mobility status  GP     Fabio Asa 08/13/2012, 9:53 AM Charlotte Crumb, PT DPT  [phone]

## 2012-08-13 NOTE — Progress Notes (Signed)
Physical Therapy Treatment Patient Details Name: Cassandra Case MRN: [id number] DOB: 25-Dec-1950 Today's Date: 08/13/2012 Time: 4098-1191 PT Time Calculation (min): 28 min  PT Assessment / Plan / Recommendation Comments on Treatment Session  Pt is making steady progress towards PT goals. Anticipate pt will be safe for discharge home in 1 to 2 days..    Follow Up Recommendations  Home health PT     Does the patient have the potential to tolerate intense rehabilitation     Barriers to Discharge        Equipment Recommendations  None recommended by PT    Recommendations for Other Services    Frequency 7X/week   Plan Discharge plan remains appropriate    Precautions / Restrictions Precautions Precautions: None (total anterior approach NO precautions) Restrictions Weight Bearing Restrictions: Yes RLE Weight Bearing: Weight bearing as tolerated   Pertinent Vitals/Pain 6/10    Mobility  Bed Mobility Bed Mobility: Rolling Left;Left Sidelying to Sit;Sitting - Scoot to Edge of Bed;Sit to Supine Rolling Left: 5: Supervision Left Sidelying to Sit: 5: Supervision;With rails Sitting - Scoot to Edge of Bed: 5: Supervision Sit to Supine: 4: Min guard (able to self assist with opposite LE) Details for Bed Mobility Assistance: Pt able to perform bed mobility with self assist and increased length of time required Transfers Transfers: Sit to Stand;Stand to Sit Sit to Stand: 5: Supervision Stand to Sit: 5: Supervision Details for Transfer Assistance: VC's for hand placement and upright posture Ambulation/Gait Ambulation/Gait Assistance: 5: Supervision Ambulation Distance (Feet): 80 Feet Assistive device: Rolling walker Ambulation/Gait Assistance Details: VC's for step thru gait pattern and increased velocity Gait Pattern: Step-to pattern;Decreased stride length;Decreased weight shift to right;Antalgic;Trunk flexed Gait velocity: decreased General Gait Details: Pt required multiple  breaks to relax arms.  Steady despite increased pain. Stairs: No    Exercises Total Joint Exercises Ankle Circles/Pumps: AROM;Both;20 reps     PT Goals Acute Rehab PT Goals PT Goal Formulation: With patient Time For Goal Achievement: 08/13/12 Potential to Achieve Goals: Good Pt will Stand: with modified independence PT Goal: Stand - Progress: Progressing toward goal Pt will Ambulate: >150 feet;with modified independence;with rolling walker PT Goal: Ambulate - Progress: Progressing toward goal  Visit Information  Last PT Received On: 08/13/12 Assistance Needed: +1    Subjective Data  Subjective: I know i have to get moving Patient Stated Goal: to go home   Cognition  Overall Cognitive Status: Appears within functional limits for tasks assessed/performed Arousal/Alertness: Awake/alert Orientation Level: Appears intact for tasks assessed;Oriented X4 / Intact Behavior During Session: [facility] for tasks performed    Balance  Balance Balance Assessed: Yes High Level Balance High Level Balance Activites: Side stepping;Backward walking;Turns;Direction changes;Head turns High Level Balance Comments: safe with supervision (close)  End of Session PT - End of Session Equipment Utilized During Treatment: Gait belt Activity Tolerance: Patient tolerated treatment well;Patient limited by pain;Patient limited by fatigue Patient left: in bed;with call bell/phone within reach;with family/visitor present Nurse Communication: Mobility status   GP     Fabio Asa 08/13/2012, 2:56 PM Charlotte Crumb, PT DPT  [phone]

## 2012-08-13 NOTE — Progress Notes (Signed)
Subjective: 1 Day Post-Op Procedure(s) (LRB): TOTAL HIP ARTHROPLASTY ANTERIOR APPROACH (Right)  Activity level:  oob WBAT Diet tolerance:  ok Voiding:  ok Patient reports pain as 4 on 0-10 scale.    Objective: Vital signs in last 24 hours: Temp:  [96.8 F (36 C)-97.6 F (36.4 C)] 97.5 F (36.4 C) (11/01 0534) Pulse Rate:  [65-93] 78  (11/01 0534) Resp:  [13-18] 18  (11/01 0534) BP: (109-135)/(63-88) 132/68 mmHg (11/01 0534) SpO2:  [100 %] 100 % (11/01 0534) Weight:  [66.679 kg (147 lb)] 66.679 kg (147 lb) (10/31 1600)  Labs:  Basename 08/13/12 0700  HGB 9.1*    Basename 08/13/12 0700  WBC 3.9*  RBC 3.11*  HCT 27.3*  PLT 252   No results found for this basename: NA:2,K:2,CL:2,CO2:2,BUN:2,CREATININE:2,GLUCOSE:2,CALCIUM:2 in the last 72 hours No results found for this basename: LABPT:2,INR:2 in the last 72 hours  Physical Exam:  Neurologically intact ABD soft Neurovascular intact Sensation intact distally Intact pulses distally Dorsiflexion/Plantar flexion intact Incision: dressing C/D/I No cellulitis present Compartment soft  Assessment/Plan:  1 Day Post-Op Procedure(s) (LRB): TOTAL HIP ARTHROPLASTY ANTERIOR APPROACH (Right) Advance diet Up with therapy D/C IV fluids Discharge home with home health in a day or two    Marquee Fuchs R 08/13/2012, 8:12 AM

## 2012-08-13 NOTE — Evaluation (Signed)
Occupational Therapy Evaluation Patient Details Name: Cassandra Case MRN: [id number] DOB: 1950-10-30 Today's Date: 08/13/2012 Time: 1324-4010 OT Time Calculation (min): 33 min  OT Assessment / Plan / Recommendation Clinical Impression  Pt 61 yo female s/p right THA. Pt is doing well with mobility and transfers. OT to follow acutely to increase independence with ADL's    OT Assessment  Patient needs continued OT Services    Follow Up Recommendations  Supervision - Intermittent       Equipment Recommendations  None recommended by OT       Frequency  Min 2X/week    Precautions / Restrictions Precautions Precautions: None (total anterior no px) Restrictions Weight Bearing Restrictions: Yes RLE Weight Bearing: Weight bearing as tolerated   Pertinent Vitals/Pain No pain reported prior to activity    ADL  Grooming: Performed;Wash/dry hands;Wash/dry face;Teeth care;Supervision/safety Where Assessed - Grooming: Supported standing Upper Body Dressing: Performed;Min guard Where Assessed - Upper Body Dressing: Supported standing Toilet Transfer: Performed;Min guard Statistician Method: Sit to Barista: Raised toilet seat with arms (or 3-in-1 over toilet) Toileting - Clothing Manipulation and Hygiene: Performed;Independent Where Assessed - Toileting Clothing Manipulation and Hygiene: Sit on 3-in-1 or toilet Equipment Used: Rolling walker;Gait belt Transfers/Ambulation Related to ADLs: min (A) for sit to stand and guarding for stand to sit. VC's for safe hand placemnt and sequencing of RW ADL Comments: Pt is uspervision for bed mobility. Ambulated to bathroom at supervision level. Completed toilet transfer with min guard and washed face and hands at sink level with supervision.     OT Diagnosis: Generalized weakness;Acute pain  OT Problem List: Decreased activity tolerance;Decreased safety awareness;Decreased knowledge of use of DME or AE;Pain OT Treatment  Interventions: Self-care/ADL training;Therapeutic exercise;Therapeutic activities;Patient/family education;DME and/or AE instruction   OT Goals Acute Rehab OT Goals OT Goal Formulation: With patient Time For Goal Achievement: 08/27/12 Potential to Achieve Goals: Good ADL Goals Pt Will Perform Lower Body Bathing: with supervision;Sit to stand from chair;Sit to stand from bed ADL Goal: Lower Body Bathing - Progress: Goal set today Pt Will Perform Lower Body Dressing: with supervision;Sit to stand from bed;Sit to stand from chair ADL Goal: Lower Body Dressing - Progress: Goal set today Pt Will Perform Tub/Shower Transfer: Shower transfer with supervision; to shower seat with back ADL Goal: Tub/Shower Transfer-Progress: Goal set today Miscellaneous OT Goals Miscellaneous OT Goal #1: Pt will be mod independent with bed mobility as precursor to performing ADL's OT Goal: Miscellaneous Goal #1 - Progress: Goal set today  Visit Information  Last OT Received On: 08/13/12 Assistance Needed: +1 PT/OT Co-Evaluation/Treatment: Yes    Subjective Data  Subjective: I havent been up yet but I am ready to try  Patient Stated Goal: to go home   Prior Functioning     Home Living Lives With: Spouse Available Help at Discharge: Family Type of Home: House Home Access: Stairs to enter Secretary/administrator of Steps: 2 Entrance Stairs-Rails: None Home Layout: Two level;Able to live on main level with bedroom/bathroom Bathroom Toilet: Standard Bathroom Accessibility: No Tub/shower: Walk in shower Home Adaptive Equipment: Bedside commode/3-in-1;Straight cane;Shower chair with back;Walker - rolling Prior Function Level of Independence: Independent with assistive device(s) (cane) Able to Take Stairs?: Yes Driving: Yes Vocation: Full time employment Comments: standing a lot at work Communication Communication: No difficulties Dominant Hand: Right            Cognition  Overall Cognitive  Status: Appears within functional limits for tasks assessed/performed Arousal/Alertness: Awake/alert  Orientation Level: Appears intact for tasks assessed;Oriented X4 / Intact Behavior During Session: [facility] for tasks performed    Extremity/Trunk Assessment Right Upper Extremity Assessment RUE ROM/Strength/Tone: [facility] for tasks assessed Left Upper Extremity Assessment LUE ROM/Strength/Tone: [facility] for tasks assessed Right Lower Extremity Assessment RLE ROM/Strength/Tone: Deficits;Unable to fully assess;Due to pain;Due to precautions Left Lower Extremity Assessment LLE ROM/Strength/Tone: [facility] for tasks assessed;Deficits;Due to pain Trunk Assessment Trunk Assessment: Normal     Mobility Bed Mobility Bed Mobility: Rolling Left;Left Sidelying to Sit;Sitting - Scoot to Edge of Bed Rolling Left: 5: Supervision Left Sidelying to Sit: 5: Supervision;With rails Sitting - Scoot to Edge of Bed: 5: Supervision Details for Bed Mobility Assistance: Able to use UE to (A) with RLE during bed mobility Transfers Transfers: Sit to Stand;Stand to Sit Sit to Stand: 4: Min assist;From bed;With upper extremity assist;4: Min guard;From chair/3-in-1;With armrests Stand to Sit: 4: Min guard;To chair/3-in-1;With armrests Details for Transfer Assistance: VC's for hand placement and upright posture          Exercise Total Joint Exercises Ankle Circles/Pumps: AROM;Both;20 reps Long Arc Quad: AROM;10 reps;Right   Balance Balance Balance Assessed: Yes High Level Balance High Level Balance Activites: Side stepping;Backward walking;Turns;Direction changes;Head turns High Level Balance Comments: completes with min guard and increased time   End of Session OT - End of Session Equipment Utilized During Treatment: Gait belt Activity Tolerance: Patient tolerated treatment well Patient left: in chair;with call bell/phone within reach Nurse Communication: Mobility status  I agree with the evaluation above  CL Ignacia Palma, OTR/L [phone] 08/13/2012         Cleora Fleet 08/13/2012, 10:00 AM

## 2012-08-14 DIAGNOSIS — E876 Hypokalemia: Secondary | ICD-10-CM | POA: Diagnosis not present

## 2012-08-14 LAB — CBC
HCT: 27.5 % — ABNORMAL LOW (ref 36.0–46.0)
MCHC: 32.7 g/dL (ref 30.0–36.0)
MCV: 86.8 fL (ref 78.0–100.0)
Platelets: 257 10*3/uL (ref 150–400)
RDW: 14.1 % (ref 11.5–15.5)
WBC: 6.3 10*3/uL (ref 4.0–10.5)

## 2012-08-14 LAB — BASIC METABOLIC PANEL
Chloride: 96 mEq/L (ref 96–112)
GFR calc Af Amer: 88 mL/min — ABNORMAL LOW (ref >90)
Potassium: 3.3 mEq/L — ABNORMAL LOW (ref 3.5–5.1)
Sodium: 135 mEq/L (ref 135–145)

## 2012-08-14 MED ORDER — DIPHENHYDRAMINE HCL 25 MG PO CAPS
ORAL_CAPSULE | ORAL | Status: AC
  Filled 2012-08-14: qty 1

## 2012-08-14 MED ORDER — POTASSIUM CHLORIDE CRYS ER 20 MEQ PO TBCR
40.0000 meq | EXTENDED_RELEASE_TABLET | Freq: Once | ORAL | Status: AC
  Administered 2012-08-14: 40 meq via ORAL
  Filled 2012-08-14: qty 2

## 2012-08-14 MED ORDER — DIPHENHYDRAMINE HCL 25 MG PO CAPS
25.0000 mg | ORAL_CAPSULE | Freq: Four times a day (QID) | ORAL | Status: DC | PRN
  Administered 2012-08-14: 25 mg via ORAL

## 2012-08-14 NOTE — Progress Notes (Signed)
Occupational Therapy Treatment Patient Details Name: Cassandra Case MRN: [id number] DOB: 11-Apr-1951 Today's Date: 08/14/2012 Time: 7829-5621 OT Time Calculation (min): 27 min  OT Assessment / Plan / Recommendation Comments on Treatment Session Excellent progress. Will continue to benefit from [facility] after D/C. Ready for D/C home with available supervision.    Follow Up Recommendations  Supervision - Intermittent;Home health OT    Barriers to Discharge       Equipment Recommendations  None recommended by PT    Recommendations for Other Services    Frequency Min 2X/week   Plan Discharge plan needs to be updated    Precautions / Restrictions Precautions Precautions: None Restrictions RLE Weight Bearing: Weight bearing as tolerated   Pertinent Vitals/Pain 3    ADL  Grooming: Modified independent Where Assessed - Grooming: Unsupported standing Lower Body Bathing: Minimal assistance Where Assessed - Lower Body Bathing: Unsupported sit to stand Lower Body Dressing: Minimal assistance Where Assessed - Lower Body Dressing: Unsupported sit to stand Toilet Transfer: Supervision/safety Toilet Transfer Method: Sit to stand;Stand pivot Acupuncturist: Bedside commode Toileting - Clothing Manipulation and Hygiene: Modified independent Where Assessed - Engineer, mining and Hygiene: Standing Tub/Shower Transfer: Therapist, sports Method: Stand pivot Equipment Used: Gait belt Transfers/Ambulation Related to ADLs: S ADL Comments: Educated pt on availability of AE for LB ADL. Pt states she has had dificulty with donning/doffing socks for years. given info on availability of AE.     OT Diagnosis:    OT Problem List:   OT Treatment Interventions:     OT Goals Acute Rehab OT Goals OT Goal Formulation: With patient Time For Goal Achievement: 08/27/12 Potential to Achieve Goals: Good ADL Goals Pt Will Perform Lower Body Bathing: with  supervision;Sit to stand from chair;Sit to stand from bed ADL Goal: Lower Body Bathing - Progress: Progressing toward goals Pt Will Perform Lower Body Dressing: with supervision;Sit to stand from bed;Sit to stand from chair ADL Goal: Lower Body Dressing - Progress: Progressing toward goals Pt Will Perform Tub/Shower Transfer: Shower transfer;with supervision;Ambulation;with DME;Shower seat with back ADL Goal: Web designer - Progress: Met Miscellaneous OT Goals Miscellaneous OT Goal #1: Pt will be mod independent with bed mobility as precursor to performing ADL's OT Goal: Miscellaneous Goal #1 - Progress: Progressing toward goals  Visit Information  Last OT Received On: 08/14/12 Assistance Needed: +1    Subjective Data      Prior Functioning       Cognition  Overall Cognitive Status: Appears within functional limits for tasks assessed/performed Arousal/Alertness: Awake/alert Orientation Level: Appears intact for tasks assessed Behavior During Session: [facility] for tasks performed    Mobility  Shoulder Instructions Bed Mobility Bed Mobility: Supine to Sit;Sitting - Scoot to Edge of Bed Rolling Left: 5: Supervision Left Sidelying to Sit: 5: Supervision Supine to Sit: 5: Supervision Sitting - Scoot to Edge of Bed: 6: Modified independent (Device/Increase time) Details for Bed Mobility Assistance: assist with RLE Transfers Transfers: Sit to Stand;Stand to Sit Sit to Stand: 5: Supervision;With upper extremity assist;From bed Stand to Sit: 6: Modified independent (Device/Increase time);With upper extremity assist;To chair/3-in-1 Details for Transfer Assistance: good technique       Exercises      Balance  WFL   End of Session OT - End of Session Equipment Utilized During Treatment: Gait belt Activity Tolerance: Patient tolerated treatment well Patient left: in bed;with call bell/phone within reach;with family/visitor present Nurse Communication: Mobility status  GO  Tyshawna Alarid,HILLARY 08/14/2012, 2:36 PM [facility], OTR/L  [phone] 08/14/2012

## 2012-08-14 NOTE — Progress Notes (Signed)
Physical Therapy Treatment Patient Details Name: Cassandra Case MRN: [id number] DOB: 1950-11-12 Today's Date: 08/14/2012 Time: 4098-1191 PT Time Calculation (min): 23 min  PT Assessment / Plan / Recommendation Comments on Treatment Session  Good progress with gait distance noted this PM.  PT to address stair training tomorrow AM.  Plan is for possible d/c home tomorrow.    Follow Up Recommendations  Home health PT     Does the patient have the potential to tolerate intense rehabilitation     Barriers to Discharge        Equipment Recommendations  None recommended by PT    Recommendations for Other Services    Frequency 7X/week   Plan Discharge plan remains appropriate;Frequency remains appropriate    Precautions / Restrictions Precautions Precautions: None Restrictions RLE Weight Bearing: Weight bearing as tolerated   Pertinent Vitals/Pain 6/10    Mobility  Bed Mobility Bed Mobility: Supine to Sit;Sitting - Scoot to Edge of Bed Rolling Left: 5: Supervision Left Sidelying to Sit: 5: Supervision Supine to Sit: 5: Supervision Sitting - Scoot to Edge of Bed: 6: Modified independent (Device/Increase time) Sit to Supine: 4: Min guard Details for Bed Mobility Assistance: able to assist RLE by crossing LLE behind Transfers Sit to Stand: 5: Supervision;From chair/3-in-1;With armrests Stand to Sit: 6: Modified independent (Device/Increase time);With upper extremity assist;To bed Details for Transfer Assistance: verbal cues for hand placement Ambulation/Gait Ambulation/Gait Assistance: 5: Supervision Ambulation Distance (Feet): 125 Feet Assistive device: Rolling walker Ambulation/Gait Assistance Details: verbal cues for RW management and posture Gait Pattern: Step-to pattern;Antalgic Gait velocity: decreased    Exercises Total Joint Exercises Ankle Circles/Pumps: AROM;Both;10 reps;Supine Gluteal Sets: AROM;Both;10 reps;Supine Heel Slides: AROM;Right;10 reps;Supine Hip  ABduction/ADduction: AAROM;Right;10 reps;Supine   PT Diagnosis:    PT Problem List:   PT Treatment Interventions:     PT Goals Acute Rehab PT Goals PT Goal: Stand - Progress: Progressing toward goal PT Goal: Ambulate - Progress: Progressing toward goal PT Goal: Up/Down Stairs - Progress: Progressing toward goal PT Goal: Perform Home Exercise Program - Progress: Progressing toward goal  Visit Information  Last PT Received On: 08/14/12 Assistance Needed: +1    Subjective Data  Subjective: "I am so sleepy." Patient Stated Goal: home   Cognition  Overall Cognitive Status: Appears within functional limits for tasks assessed/performed Arousal/Alertness: Awake/alert Orientation Level: Appears intact for tasks assessed Behavior During Session: [facility] for tasks performed    Balance     End of Session PT - End of Session Equipment Utilized During Treatment: Gait belt Activity Tolerance: Patient tolerated treatment well Patient left: in bed;with call bell/phone within reach;with family/visitor present   GP     Ilda Foil 08/14/2012, 3:14 PM Aida Raider, PT  Office # [phone] Pager [phone]

## 2012-08-14 NOTE — Progress Notes (Signed)
Physical Therapy Treatment Patient Details Name: Cassandra Case MRN: [id number] DOB: 1951-05-30 Today's Date: 08/14/2012 Time: 4098-1191 PT Time Calculation (min): 20 min  PT Assessment / Plan / Recommendation Comments on Treatment Session  Pt making steady progress.    Follow Up Recommendations  Home health PT     Does the patient have the potential to tolerate intense rehabilitation     Barriers to Discharge        Equipment Recommendations  None recommended by PT    Recommendations for Other Services    Frequency 7X/week   Plan Discharge plan remains appropriate;Frequency remains appropriate    Precautions / Restrictions Precautions Precautions: None Restrictions RLE Weight Bearing: Weight bearing as tolerated   Pertinent Vitals/Pain 5/10    Mobility  Bed Mobility Left Sidelying to Sit: 4: Min assist Sitting - Scoot to Edge of Bed: 4: Min assist Details for Bed Mobility Assistance: assist with RLE Transfers Sit to Stand: 4: Min assist;From bed;With upper extremity assist;From chair/3-in-1 Stand to Sit: 5: Supervision;To toilet;To chair/3-in-1;With upper extremity assist Details for Transfer Assistance: verbal cues for sequencing Ambulation/Gait Ambulation/Gait Assistance: 4: Min guard Ambulation Distance (Feet): 15 Feet (x 2) Assistive device: Rolling walker Ambulation/Gait Assistance Details: verbal cues for step length, posture and RW management Gait Pattern: Step-to pattern;Antalgic Gait velocity: decreased    Exercises     PT Diagnosis:    PT Problem List:   PT Treatment Interventions:     PT Goals Acute Rehab PT Goals PT Goal: Stand - Progress: Progressing toward goal PT Goal: Ambulate - Progress: Progressing toward goal PT Goal: Up/Down Stairs - Progress: Progressing toward goal PT Goal: Perform Home Exercise Program - Progress: Progressing toward goal  Visit Information  Last PT Received On: 08/14/12 Assistance Needed: +1    Subjective  Data  Subjective: "I just can't seem to wake up today." Patient Stated Goal: home   Cognition  Overall Cognitive Status: Appears within functional limits for tasks assessed/performed Arousal/Alertness: Awake/alert Orientation Level: Appears intact for tasks assessed Behavior During Session: [facility] for tasks performed    Balance     End of Session PT - End of Session Equipment Utilized During Treatment: Gait belt Activity Tolerance: Patient tolerated treatment well Patient left: in chair;with call bell/phone within reach   GP     Ilda Foil 08/14/2012, 1:20 PM  Aida Raider, PT  Office # [phone] Pager [phone]

## 2012-08-14 NOTE — Progress Notes (Signed)
Subjective: 2 Days Post-Op Activity level:  Has been out of bed with therapy. They recommend an additional day of PT Diet tolerance:  ok Voiding:  ok Patient reports pain as 4 on 0-10 scale.    Objective: Vital signs in last 24 hours: Temp:  [97.9 F (36.6 C)-99.7 F (37.6 C)] 99.7 F (37.6 C) (11/02 4098) Pulse Rate:  [67-76] 73  (11/02 0613) Resp:  [16] 16  (11/02 1191) BP: (116-127)/(52-66) 127/58 mmHg (11/02 0613) SpO2:  [99 %-100 %] 100 % (11/02 0613)  Labs:  Basename 08/14/12 0505 08/13/12 0700  HGB 9.0* 9.1*    Basename 08/14/12 0505 08/13/12 0700  WBC 6.3 3.9*  RBC 3.17* 3.11*  HCT 27.5* 27.3*  PLT 257 252    Basename 08/13/12 0700  NA 135  K 3.0*  CL 96  CO2 29  BUN 7  CREATININE 0.75  GLUCOSE 120*  CALCIUM 9.3   No results found for this basename: LABPT:2,INR:2 in the last 72 hours  Physical Exam:  Neurologically intact ABD soft Neurovascular intact Sensation intact distally Intact pulses distally Dorsiflexion/Plantar flexion intact Incision: dressing C/D/I No cellulitis present Compartment soft  Assessment/Plan:  2 Days Post-Op Procedure(s) (LRB): TOTAL HIP ARTHROPLASTY ANTERIOR APPROACH (Right) Advance diet Up with therapy D/C IV fluids Plan for discharge tomorrow Potassium 40 mEq 1 times a day repeat b met in the morning She will work with therapy today and we plan discharge tomorrow.    Marc Sivertsen R 08/14/2012, 8:18 AM

## 2012-08-15 MED ORDER — ASPIRIN 325 MG PO TBEC: 325.0000 mg | DELAYED_RELEASE_TABLET | Freq: Two times a day (BID) | ORAL | Status: DC

## 2012-08-15 MED ORDER — METHOCARBAMOL 500 MG PO TABS: 500.0000 mg | ORAL_TABLET | Freq: Four times a day (QID) | ORAL | Status: DC | PRN

## 2012-08-15 MED ORDER — HYDROCODONE-ACETAMINOPHEN 7.5-325 MG PO TABS: 1.0000 | ORAL_TABLET | ORAL | Status: DC | PRN

## 2012-08-15 NOTE — Progress Notes (Signed)
Physical Therapy Treatment Patient Details Name: Cassandra Case MRN: [id number] DOB: 17-Jan-1951 Today's Date: 08/15/2012 Time: 0865-7846 PT Time Calculation (min): 40 min  PT Assessment / Plan / Recommendation Comments on Treatment Session  Pt with orders for d/c home today.  Pt to continue progress toward goals with HHPT.    Follow Up Recommendations  Home health PT     Does the patient have the potential to tolerate intense rehabilitation     Barriers to Discharge        Equipment Recommendations  None recommended by PT    Recommendations for Other Services    Frequency 7X/week   Plan Discharge plan remains appropriate    Precautions / Restrictions Precautions Precautions: None Restrictions RLE Weight Bearing: Weight bearing as tolerated   Pertinent Vitals/Pain 3/10    Mobility  Bed Mobility Left Sidelying to Sit: 4: Min assist;HOB flat Details for Bed Mobility Assistance: assist needed for RLE Transfers Sit to Stand: 5: Supervision;With upper extremity assist;From chair/3-in-1;From bed Stand to Sit: 6: Modified independent (Device/Increase time);To chair/3-in-1;With armrests Ambulation/Gait Ambulation/Gait Assistance: 5: Supervision Ambulation Distance (Feet): 250 Feet Assistive device: Rolling walker Ambulation/Gait Assistance Details: verbal cues for RW management and posture Gait Pattern: Step-to pattern;Antalgic Gait velocity: decreased General Gait Details: verbal cues to relax shoulders/UEs Stairs: Yes Stairs Assistance: 4: Min assist Stairs Assistance Details (indicate cue type and reason): verbal cues for sequencing, technique Stair Management Technique: Backwards;With walker Number of Stairs: 3     Exercises     PT Diagnosis:    PT Problem List:   PT Treatment Interventions:     PT Goals Acute Rehab PT Goals PT Goal: Stand - Progress: Progressing toward goal PT Goal: Ambulate - Progress: Progressing toward goal PT Goal: Up/Down Stairs -  Progress: Met PT Goal: Perform Home Exercise Program - Progress: Progressing toward goal  Visit Information  Last PT Received On: 08/15/12 Assistance Needed: +1    Subjective Data  Subjective: "I just can't wake up." Patient Stated Goal: home today   Cognition  Overall Cognitive Status: Appears within functional limits for tasks assessed/performed Arousal/Alertness: Awake/alert Orientation Level: Appears intact for tasks assessed Behavior During Session: [facility] for tasks performed    Balance     End of Session PT - End of Session Equipment Utilized During Treatment: Gait belt Activity Tolerance: Patient tolerated treatment well Patient left: in chair;with call bell/phone within reach Nurse Communication: Mobility status   GP     Ilda Foil 08/15/2012, 10:37 AM  Aida Raider, PT  Office # [phone] Pager [phone]

## 2012-08-15 NOTE — Discharge Summary (Signed)
Patient ID: Cassandra Case MRN: [id number] DOB/AGE: 59/20/1952 59 y.o.  Admit date: 08/12/2012 Discharge date: 08/15/2012  Admission Diagnoses:  Principal Problem:  *DJD (degenerative joint disease) of hip Active Problems:  Hypokalemia   Discharge Diagnoses:  Same  Past Medical History  Diagnosis Date  . Hypertension     takes Maxzide daily  . Arthritis   . Joint pain   . Joint swelling   . Rheumatoid arthritis   . Chronic back pain     hx of buldging disc    Surgeries: Procedure(s): TOTAL HIP ARTHROPLASTY ANTERIOR APPROACH on 08/12/2012   Consultants:    Discharged Condition: Improved  Hospital Course: Cassandra Case is an 61 y.o. female who was admitted 08/12/2012 for operative treatment ofDJD (degenerative joint disease) of hip. Patient has severe unremitting pain that affects sleep, daily activities, and work/hobbies. After pre-op clearance the patient was taken to the operating room on 08/12/2012 and underwent  Procedure(s): TOTAL HIP ARTHROPLASTY ANTERIOR APPROACH.    Patient was given perioperative antibiotics: Anti-infectives     Start     Dose/Rate Route Frequency Ordered Stop   08/12/12 1800   ceFAZolin (ANCEF) IVPB 2 g/50 mL premix        2 g 100 mL/hr over 30 Minutes Intravenous Every 6 hours 08/12/12 1548 08/13/12 0100   08/11/12 1424   ceFAZolin (ANCEF) IVPB 2 g/50 mL premix        2 g 100 mL/hr over 30 Minutes Intravenous 60 min pre-op 08/11/12 1424 08/12/12 1205           Patient was given sequential compression devices, early ambulation, and chemoprophylaxis to prevent DVT.  Patient benefited maximally from hospital stay and there were no complications.    Recent vital signs: Patient Vitals for the past 24 hrs:  BP Temp Temp src Pulse Resp SpO2  08/15/12 0700 117/51 mmHg 98.3 F (36.8 C) Oral 76  18  100 %  08/15/12 0400 - - - - 20  100 %  08/15/12 0000 - - - - 24  100 %  08-30-2012 2012 102/63 mmHg 97.5 F (36.4 C) Axillary 81  16  100  %  2012-08-30 2000 - - - - 16  100 %  30-Aug-2012 1600 - - - - 16  99 %  30-Aug-2012 1431 131/66 mmHg 97.6 F (36.4 C) Oral 76  18  100 %  08/30/12 1200 - - - - 16  99 %     Recent laboratory studies:  Basename 08/30/12 2241 August 30, 2012 0505 08/13/12 0700  WBC -- 6.3 3.9*  HGB -- 9.0* 9.1*  HCT -- 27.5* 27.3*  PLT -- 257 252  NA 135 -- 135  K 3.3* -- 3.0*  CL 96 -- 96  CO2 31 -- 29  BUN 7 -- 7  CREATININE 0.83 -- 0.75  GLUCOSE 111* -- 120*  INR -- -- --  CALCIUM 9.6 -- --     Discharge Medications:     Medication List     As of 08/15/2012 11:11 AM    STOP taking these medications         HYDROcodone-acetaminophen 5-500 MG per tablet   Commonly known as: VICODIN      TAKE these medications         aspirin 325 MG EC tablet   Take 1 tablet (325 mg total) by mouth 2 (two) times daily.      HYDROcodone-acetaminophen 7.5-325 MG per tablet   Commonly known as: NORCO  Take 1-2 tablets by mouth every 4 (four) hours as needed (breakthrough pain).      methocarbamol 500 MG tablet   Commonly known as: ROBAXIN   Take 1 tablet (500 mg total) by mouth every 6 (six) hours as needed.      triamterene-hydrochlorothiazide 75-50 MG per tablet   Commonly known as: MAXZIDE   Take 1 tablet by mouth daily.      zolpidem 10 MG tablet   Commonly known as: AMBIEN   Take 5 mg by mouth at bedtime as needed. For sleep        Diagnostic Studies: Dg Chest 2 View  08/05/2012  *RADIOLOGY REPORT*  Clinical Data: Obtained prior than hip arthroplasty.  CHEST - 2 VIEW  Comparison: None. Prior is not currently available.  Findings: The lungs are clear and fully expanded.  No effusions or pneumothoraces.  The heart, mediastinal and hilar contours are normal.  There are no acute bony abnormalities.  IMPRESSION: No active disease.   Original Report Authenticated By: Mervin Hack, M.D.    Dg Hip Operative Right  08/12/2012  *RADIOLOGY REPORT*  Clinical Data: Avascular necrosis of the right hip.   DG OPERATIVE RIGHT HIP  Comparison: MRI dated 07/18/2012  Findings: Two AP C-arm images demonstrate the patient has undergone right total hip prosthesis insertion.  The components appear in good position in the AP projection.  No fractures.  IMPRESSION: Insertion of a right total hip prosthesis as described.   Original Report Authenticated By: Francene Boyers, M.D.    Mr Hip Right Wo Contrast  07/19/2012  *RADIOLOGY REPORT*  Clinical Data: Bilateral hip pain.  Avascular necrosis.  MRI LEFT HIP WITHOUT CONTRAST  Technique:  Multiplanar, multisequence MR imaging was performed. No intravenous contrast was administered.  Comparison: Radiograph 07/14/2012.  Findings: There is heterogenous marrow with loss of normal fatty marrow signal, which is a nonspecific finding. It is commonly associated with anemia, chronic disease, obesity, cigarette smoking. The visceral pelvis appears within normal limits.  Mild left sacroiliac joint degenerative disease is present with subchondral cysts in the inferior aspect of the joint.  Left femoral head avascular necrosis is present with subchondral collapse and irregularity of the femoral head.  There is a moderate hip effusion with degenerative synovitis.  Moderate secondary osteoarthritis of the left hip.  Acetabular subchondral edema is present.  The edema in the left femoral head radiates into the neck and intertrochanteric region.  Left hip girdle musculature appears within normal limits. Left acetabular labrum is macerated.  No large paralabral cysts are identified.  IMPRESSION: 1.  Left hip AVN with subchondral collapse and radiating bone marrow edema into the intertrochanteric proximal left femur. 2.  Moderate secondary left hip osteoarthritis.  Effusion and degenerative synovitis of the left hip. 3.  Maceration of the left acetabular labrum.  MRI RIGHT HIP WITHOUT CONTRAST  Technique:  Multiplanar, multisequence MR imaging of the right hip was performed.  No intravenous contrast  was administered.  Findings:  Right hip AVN is present with subchondral collapse greater than on the left side.  Subchondral irregularity and collapse is also present.  Moderate to severe secondary osteoarthritis.  Maceration of the acetabular labrum is again noted.  The subchondral edema is present in the acetabulum and femoral neck to a greater extent than on the left side.  Effusion and synovitis is also present.  Periarticular edema radiates in the soft tissues around the right hip.  No muscular atrophy.  IMPRESSION: 1.  Right  hip AVN, with reactive edema and inflammatory changes to a greater extent than the left hip. 2. Moderate to severe secondary osteoarthritis. Hip effusion and degenerative synovitis. 3.  Maceration of the right acetabular labrum.   Original Report Authenticated By: Andreas Newport, M.D.    Mr Hip Left Wo Contrast  07/19/2012  *RADIOLOGY REPORT*  Clinical Data: Bilateral hip pain.  Avascular necrosis.  MRI LEFT HIP WITHOUT CONTRAST  Technique:  Multiplanar, multisequence MR imaging was performed. No intravenous contrast was administered.  Comparison: Radiograph 07/14/2012.  Findings: There is heterogenous marrow with loss of normal fatty marrow signal, which is a nonspecific finding. It is commonly associated with anemia, chronic disease, obesity, cigarette smoking. The visceral pelvis appears within normal limits.  Mild left sacroiliac joint degenerative disease is present with subchondral cysts in the inferior aspect of the joint.  Left femoral head avascular necrosis is present with subchondral collapse and irregularity of the femoral head.  There is a moderate hip effusion with degenerative synovitis.  Moderate secondary osteoarthritis of the left hip.  Acetabular subchondral edema is present.  The edema in the left femoral head radiates into the neck and intertrochanteric region.  Left hip girdle musculature appears within normal limits. Left acetabular labrum is macerated.  No large  paralabral cysts are identified.  IMPRESSION: 1.  Left hip AVN with subchondral collapse and radiating bone marrow edema into the intertrochanteric proximal left femur. 2.  Moderate secondary left hip osteoarthritis.  Effusion and degenerative synovitis of the left hip. 3.  Maceration of the left acetabular labrum.  MRI RIGHT HIP WITHOUT CONTRAST  Technique:  Multiplanar, multisequence MR imaging of the right hip was performed.  No intravenous contrast was administered.  Findings:  Right hip AVN is present with subchondral collapse greater than on the left side.  Subchondral irregularity and collapse is also present.  Moderate to severe secondary osteoarthritis.  Maceration of the acetabular labrum is again noted.  The subchondral edema is present in the acetabulum and femoral neck to a greater extent than on the left side.  Effusion and synovitis is also present.  Periarticular edema radiates in the soft tissues around the right hip.  No muscular atrophy.  IMPRESSION: 1.  Right hip AVN, with reactive edema and inflammatory changes to a greater extent than the left hip. 2. Moderate to severe secondary osteoarthritis. Hip effusion and degenerative synovitis. 3.  Maceration of the right acetabular labrum.   Original Report Authenticated By: Andreas Newport, M.D.     Disposition: Final discharge disposition not confirmed      Discharge Orders    Future Orders Please Complete By Expires   Diet - low sodium heart healthy      Call MD / Call 911      Comments:   If you experience chest pain or shortness of breath, CALL 911 and be transported to the hospital emergency room.  If you develope a fever above 101 F, pus (white drainage) or increased drainage or redness at the wound, or calf pain, call your surgeon's office.   Constipation Prevention      Comments:   Drink plenty of fluids.  Prune juice may be helpful.  You may use a stool softener, such as Colace (over the counter) 100 mg twice a day.  Use MiraLax  (over the counter) for constipation as needed.   Increase activity slowly as tolerated         Follow-up Information    Follow up with DALLDORF,PETER G,  MD. Call in 2 weeks.   Contact information:   [address] [place] [address] [phone]           Signed: Prince Rome 08/15/2012, 11:11 AM

## 2012-08-15 NOTE — Progress Notes (Signed)
Subjective: 3 Days Post-Op Procedure(s) (LRB): TOTAL HIP ARTHROPLASTY ANTERIOR APPROACH (Right)  Activity level:  OOB with PT Diet tolerance:  regular Voiding:  well Patient reports pain as mild.    Objective: Vital signs in last 24 hours: Temp:  [97.5 F (36.4 C)-98.3 F (36.8 C)] 98.3 F (36.8 C) (11/03 0700) Pulse Rate:  [76-81] 76  (11/03 0700) Resp:  [16-24] 18  (11/03 0700) BP: (102-131)/(51-66) 117/51 mmHg (11/03 0700) SpO2:  [98 %-100 %] 100 % (11/03 0700)  Labs:  Basename 08/14/12 0505 08/13/12 0700  HGB 9.0* 9.1*    Basename 08/14/12 0505 08/13/12 0700  WBC 6.3 3.9*  RBC 3.17* 3.11*  HCT 27.5* 27.3*  PLT 257 252    Basename 08/14/12 2241 08/13/12 0700  NA 135 135  K 3.3* 3.0*  CL 96 96  CO2 31 29  BUN 7 7  CREATININE 0.83 0.75  GLUCOSE 111* 120*  CALCIUM 9.6 9.3   No results found for this basename: LABPT:2,INR:2 in the last 72 hours  Physical Exam:  ABD soft Neurovascular intact Sensation intact distally Incision: dressing C/D/I  Assessment/Plan:  3 Days Post-Op Procedure(s) (LRB): TOTAL HIP ARTHROPLASTY ANTERIOR APPROACH (Right) Up with therapy Discharge home with home health (husband will pick up about 1 or 1:30 today) Has follow up scheduled for 11/13 at [facility]    Bayne Fosnaugh G 08/15/2012, 7:59 AM

## 2013-01-06 ENCOUNTER — Other Ambulatory Visit: Payer: Self-pay | Admitting: Orthopaedic Surgery

## 2013-01-10 ENCOUNTER — Other Ambulatory Visit: Payer: Self-pay | Admitting: Orthopaedic Surgery

## 2013-01-10 ENCOUNTER — Encounter (HOSPITAL_COMMUNITY): Payer: Self-pay

## 2013-01-12 ENCOUNTER — Encounter (HOSPITAL_COMMUNITY)
Admission: RE | Admit: 2013-01-12 | Discharge: 2013-01-12 | Disposition: A | Discharge status: Home / Self Care | Payer: BC Managed Care – PPO | Source: Ambulatory Visit | Attending: Orthopaedic Surgery | Admitting: Orthopaedic Surgery

## 2013-01-12 ENCOUNTER — Encounter (HOSPITAL_COMMUNITY): Payer: Self-pay

## 2013-01-12 DIAGNOSIS — Z01812 Encounter for preprocedural laboratory examination: Secondary | ICD-10-CM | POA: Insufficient documentation

## 2013-01-12 DIAGNOSIS — Z01818 Encounter for other preprocedural examination: Secondary | ICD-10-CM | POA: Insufficient documentation

## 2013-01-12 DIAGNOSIS — I10 Essential (primary) hypertension: Secondary | ICD-10-CM | POA: Insufficient documentation

## 2013-01-12 DIAGNOSIS — M069 Rheumatoid arthritis, unspecified: Secondary | ICD-10-CM | POA: Insufficient documentation

## 2013-01-12 LAB — URINALYSIS, ROUTINE W REFLEX MICROSCOPIC
Bilirubin Urine: NEGATIVE
Hgb urine dipstick: NEGATIVE
Ketones, ur: NEGATIVE mg/dL
Nitrite: NEGATIVE
Urobilinogen, UA: 1 mg/dL (ref 0.0–1.0)

## 2013-01-12 LAB — PROTIME-INR
INR: 0.98 (ref 0.00–1.49)
Prothrombin Time: 12.9 seconds (ref 11.6–15.2)

## 2013-01-12 LAB — BASIC METABOLIC PANEL
CO2: 29 mEq/L (ref 19–32)
Calcium: 10.2 mg/dL (ref 8.4–10.5)
GFR calc Af Amer: 83 mL/min — ABNORMAL LOW (ref >90)
GFR calc non Af Amer: 72 mL/min — ABNORMAL LOW (ref >90)
Sodium: 140 mEq/L (ref 135–145)

## 2013-01-12 LAB — CBC WITH DIFFERENTIAL/PLATELET
Lymphocytes Relative: 30 % (ref 12–46)
Lymphs Abs: 1.5 10*3/uL (ref 0.7–4.0)
MCV: 87.7 fL (ref 78.0–100.0)
Neutro Abs: 3.1 10*3/uL (ref 1.7–7.7)
Neutrophils Relative %: 60 % (ref 43–77)
Platelets: 318 10*3/uL (ref 150–400)
RBC: 3.91 MIL/uL (ref 3.87–5.11)
WBC: 5.1 10*3/uL (ref 4.0–10.5)

## 2013-01-12 LAB — SURGICAL PCR SCREEN: MRSA, PCR: NEGATIVE

## 2013-01-12 LAB — TYPE AND SCREEN
ABO/RH(D): O POS
Antibody Screen: NEGATIVE

## 2013-01-12 LAB — APTT: aPTT: 29 seconds (ref 24–37)

## 2013-01-12 NOTE — H&P (Signed)
TOTAL HIP ADMISSION H&P  Patient is admitted for left total hip arthroplasty.  Subjective:  Chief Complaint: left hip pain  HPI: Cassandra Case, 62 y.o. female, has a history of pain and functional disability in the left hip(s) due to arthritis and patient has failed non-surgical conservative treatments for greater than 12 weeks to include NSAID's and/or analgesics, flexibility and strengthening excercises, supervised PT with diminished ADL's post treatment, use of assistive devices and activity modification.  Onset of symptoms was gradual starting 5 years ago with gradually worsening course since that time.The patient noted nno past left hip surgery but right hip has been replaced recently on the left hip(s).  Patient currently rates pain in the left hip at 10 out of 10 with activity. Patient has night pain, worsening of pain with activity and weight bearing, trendelenberg gait, pain that interfers with activities of daily living and pain with passive range of motion. Patient has evidence of subchondral sclerosis, periarticular osteophytes and joint space narrowing by imaging studies. This condition presents safety issues increasing the risk of falls. This patient has had Avascular necrosis.  There is no current active infection.  Patient Active Problem List   Diagnosis Date Noted  . DJD (degenerative joint disease) of hip 08/12/2012    Priority: High    Class: Chronic  . Hypokalemia 08/14/2012    Priority: Low    Class: Acute   Past Medical History  Diagnosis Date  . Hypertension     takes Maxzide daily  . Arthritis   . Joint pain   . Joint swelling   . Rheumatoid arthritis   . Chronic back pain     hx of buldging disc    Past Surgical History  Procedure Laterality Date  . Hernia repair      at age 79-umbilical  . Tonsillectomy      at age 39  . Abdominal hysterectomy    . Back surgery  1987/2013  . Colonoscopy    . Total hip arthroplasty  08/12/2012    Procedure: TOTAL HIP  ARTHROPLASTY ANTERIOR APPROACH;  Surgeon: Velna Ochs, MD;  Location: [facility] OR;  Service: Orthopedics;  Laterality: Right;    No prescriptions prior to admission   No Known Allergies  History  Substance Use Topics  . Smoking status: Never Smoker   . Smokeless tobacco: Not on file  . Alcohol Use: No    No family history on file.   Review of Systems  Constitutional: Negative.   HENT: Negative.   Eyes: Negative.   Respiratory: Negative.   Cardiovascular: Negative.   Gastrointestinal: Negative.   Genitourinary: Negative.   Musculoskeletal: Positive for joint pain.  Skin: Negative.   Neurological: Negative.   Endo/Heme/Allergies: Negative.   Psychiatric/Behavioral: Negative.     Objective:  Physical Exam  Constitutional: She is oriented to person, place, and time. She appears well-nourished.  HENT:  Head: Atraumatic.  Eyes: Pupils are equal, round, and reactive to light.  Neck: Normal range of motion.  Cardiovascular: Regular rhythm.   Respiratory: Breath sounds normal.  GI: Bowel sounds are normal.  Musculoskeletal:  Left hip exam: Leg lengths equal.  Left hip terribly painful with any attempts of motion.  Extremely limited rotation.  Negative straight leg raising bilaterally.  Good pulses distally and sensory normal.  Neurological: She is oriented to person, place, and time.  Skin: Skin is dry.  Psychiatric: She has a normal mood and affect.    Vital signs in last 24 hours:  Temp:  [97.9 F (36.6 C)] 97.9 F (36.6 C) (04/02 1020) Pulse Rate:  [73] 73 (04/02 1020) Resp:  [20] 20 (04/02 1020) BP: (135)/(72) 135/72 mmHg (04/02 1020) SpO2:  [99 %] 99 % (04/02 1020) Weight:  [64.275 kg (141 lb 11.2 oz)] 64.275 kg (141 lb 11.2 oz) (04/02 1020)  Labs:   Estimated body mass index is 25.31 kg/(m^2) as calculated from the following:   Height as of 08/12/12: 5\' 4"  (1.626 m).   Weight as of 08/05/12: 66.906 kg (147 lb 8 oz).   Imaging Review Plain radiographs  demonstrate severe degenerative joint disease of the left hip(s). The bone quality appears to be fair for age and reported activity level.  Assessment/Plan:  End stage arthritis, left hip(s)  The patient history, physical examination, clinical judgement of the provider and imaging studies are consistent with end stage degenerative joint disease of the left hip(s) and total hip arthroplasty is deemed medically necessary. The treatment options including medical management, injection therapy, arthroscopy and arthroplasty were discussed at length. The risks and benefits of total hip arthroplasty were presented and reviewed. The risks due to aseptic loosening, infection, stiffness, dislocation/subluxation,  thromboembolic complications and other imponderables were discussed.  The patient acknowledged the explanation, agreed to proceed with the plan and consent was signed. Patient is being admitted for inpatient treatment for surgery, pain control, PT, OT, prophylactic antibiotics, VTE prophylaxis, progressive ambulation and ADL's and discharge planning.The patient is planning to be discharged home with home health services.  Patient was noted to have a low potassium on her preoperative labs and we had her consult with her PCP Dr. Clelia Croft for supplementation preoperatively.

## 2013-01-12 NOTE — Pre-Procedure Instructions (Signed)
LUWANNA BROSSMAN  01/12/2013   Your procedure is scheduled on: Tuesday, April 8th   Report to [facility] at 8:00 AM.  Call this number if you have problems the morning of surgery: [phone]   Remember:   Do not eat food or drink liquids after midnight Monday.   Take these medicines the morning of surgery with A SIP OF WATER: Norco, Robaxin   Do not wear jewelry, make-up or nail polish.  Do not wear lotions, powders, or perfumes. You may wear deodorant.  Do not shave underarms & legs 48 hours prior to surgery.    Do not bring valuables to the hospital.  Contacts, dentures or bridgework may not be worn into surgery.   Leave suitcase in the car. After surgery it may be brought to your room.  For patients admitted to the hospital, checkout time is 11:00 AM the day of discharge.     Name and phone number of your driver:   Special Instructions: Shower using CHG 2 nights before surgery and the night before surgery.  If you shower the day of surgery use CHG.  Use special wash - you have one bottle of CHG for all showers.  You should use approximately 1/3 of the bottle for each shower.   Please read over the following fact sheets that you were given: Pain Booklet, Coughing and Deep Breathing, Blood Transfusion Information, MRSA Information and Surgical Site Infection Prevention

## 2013-01-12 NOTE — Progress Notes (Signed)
12:40 I SPOKE WITH Dr. Jerl Santos and informed him of potassium level of 2.9 and Hgb 11.5.Marland KitchenMarland KitchenMarland Kitchen"Thank you.".Marland KitchenMarland KitchenDA

## 2013-01-13 NOTE — Progress Notes (Signed)
Anesthesia chart review: Patient is a 62 year old female scheduled for left THA by Dr. Jerl Santos on 01/18/2013. History includes nonsmoker, hypertension, rheumatoid arthritis, hysterectomy, tonsillectomy, back surgery, right THA 07/2012. PCP is listed as Dr. Lupita Raider.  EKG on 08/12/12 showed NSR, anterior T wave abnormality, consider ischemia.  She has since tolerated a right THA on 08/12/12.  CXR on 08/05/12 showed no active disease.  Preoperative labs noted.  K 2.9.  Dr. Jerl Santos is aware.  They have instructed her to consult with her PCP re: preoperative KCL supplementation.  She is on Maxzide.  Her Cr is WNL.  Will check an ISTAT on arrival to re-evaluate for hypokalemia. UA showed moderate leukocytes, but was otherwise WNL--this was called to Agustin Cree at Dr. Nolon Nations office. If follow-up labs acceptable then would anticipate she could proceed as planned.    Velna Ochs [facility]/Anesthesiology Phone [phone] 01/13/2013 1:44 PM

## 2013-01-17 MED ORDER — CEFAZOLIN SODIUM 10 G IJ SOLR
3.0000 g | INTRAMUSCULAR | Status: AC
  Administered 2013-01-18: 2 g via INTRAVENOUS
  Filled 2013-01-17: qty 3000

## 2013-01-18 ENCOUNTER — Inpatient Hospital Stay (HOSPITAL_COMMUNITY)
Admission: RE | Admit: 2013-01-18 | Discharge: 2013-01-20 | DRG: 818 | Disposition: A | Discharge status: Home Health | Payer: BC Managed Care – PPO | Source: Ambulatory Visit | Attending: Orthopaedic Surgery | Admitting: Orthopaedic Surgery

## 2013-01-18 ENCOUNTER — Ambulatory Visit (HOSPITAL_COMMUNITY): Payer: BC Managed Care – PPO | Admitting: Anesthesiology

## 2013-01-18 ENCOUNTER — Ambulatory Visit (HOSPITAL_COMMUNITY): Payer: BC Managed Care – PPO

## 2013-01-18 ENCOUNTER — Encounter (HOSPITAL_COMMUNITY): Payer: Self-pay | Admitting: *Deleted

## 2013-01-18 ENCOUNTER — Encounter (HOSPITAL_COMMUNITY): Payer: Self-pay | Admitting: Vascular Surgery

## 2013-01-18 ENCOUNTER — Encounter (HOSPITAL_COMMUNITY)
Admission: RE | Disposition: A | Discharge status: Home Health | Payer: Self-pay | Source: Ambulatory Visit | Attending: Orthopaedic Surgery

## 2013-01-18 DIAGNOSIS — Z79899 Other long term (current) drug therapy: Secondary | ICD-10-CM

## 2013-01-18 DIAGNOSIS — M069 Rheumatoid arthritis, unspecified: Secondary | ICD-10-CM | POA: Diagnosis present

## 2013-01-18 DIAGNOSIS — M169 Osteoarthritis of hip, unspecified: Principal | ICD-10-CM | POA: Diagnosis present

## 2013-01-18 DIAGNOSIS — Z01812 Encounter for preprocedural laboratory examination: Secondary | ICD-10-CM

## 2013-01-18 DIAGNOSIS — E876 Hypokalemia: Secondary | ICD-10-CM | POA: Diagnosis present

## 2013-01-18 DIAGNOSIS — I10 Essential (primary) hypertension: Secondary | ICD-10-CM | POA: Diagnosis present

## 2013-01-18 DIAGNOSIS — M1610 Unilateral primary osteoarthritis, unspecified hip: Principal | ICD-10-CM | POA: Diagnosis present

## 2013-01-18 HISTORY — PX: TOTAL HIP ARTHROPLASTY: SHX124

## 2013-01-18 LAB — POCT I-STAT 4, (NA,K, GLUC, HGB,HCT)
Glucose, Bld: 102 mg/dL — ABNORMAL HIGH (ref 70–99)
Hemoglobin: 13.3 g/dL (ref 12.0–15.0)
Potassium: 3.2 mEq/L — ABNORMAL LOW (ref 3.5–5.1)
Sodium: 141 mEq/L (ref 135–145)

## 2013-01-18 SURGERY — ARTHROPLASTY, HIP, TOTAL, ANTERIOR APPROACH
Anesthesia: General | Site: Hip | Laterality: Left | Wound class: Clean

## 2013-01-18 MED ORDER — LACTATED RINGERS IV SOLN: INTRAVENOUS | Status: DC

## 2013-01-18 MED ORDER — METOCLOPRAMIDE HCL 5 MG/ML IJ SOLN: 5.0000 mg | Freq: Three times a day (TID) | INTRAMUSCULAR | Status: DC | PRN

## 2013-01-18 MED ORDER — TRIAMTERENE-HCTZ 75-50 MG PO TABS
1.0000 | ORAL_TABLET | Freq: Every day | ORAL | Status: DC
  Administered 2013-01-19 – 2013-01-20 (×2): 1 via ORAL
  Filled 2013-01-18 (×2): qty 1

## 2013-01-18 MED ORDER — METHOCARBAMOL 100 MG/ML IJ SOLN
500.0000 mg | Freq: Four times a day (QID) | INTRAVENOUS | Status: DC | PRN
  Filled 2013-01-18: qty 5

## 2013-01-18 MED ORDER — LACTATED RINGERS IV SOLN
INTRAVENOUS | Status: DC
  Administered 2013-01-18: 09:00:00 via INTRAVENOUS

## 2013-01-18 MED ORDER — MIDAZOLAM HCL 5 MG/5ML IJ SOLN
INTRAMUSCULAR | Status: DC | PRN
  Administered 2013-01-18: 1 mg via INTRAVENOUS

## 2013-01-18 MED ORDER — KCL IN DEXTROSE-NACL 20-5-0.2 MEQ/L-%-% IV SOLN
INTRAVENOUS | Status: DC
  Administered 2013-01-18 – 2013-01-19 (×2): via INTRAVENOUS
  Filled 2013-01-18 (×3): qty 1000

## 2013-01-18 MED ORDER — METOCLOPRAMIDE HCL 10 MG PO TABS: 5.0000 mg | ORAL_TABLET | Freq: Three times a day (TID) | ORAL | Status: DC | PRN

## 2013-01-18 MED ORDER — OXYCODONE HCL 5 MG PO TABS: 5.0000 mg | ORAL_TABLET | Freq: Once | ORAL | Status: AC | PRN

## 2013-01-18 MED ORDER — CEFAZOLIN SODIUM-DEXTROSE 2-3 GM-% IV SOLR
2.0000 g | Freq: Four times a day (QID) | INTRAVENOUS | Status: AC
  Administered 2013-01-18 (×2): 2 g via INTRAVENOUS
  Filled 2013-01-18 (×2): qty 50

## 2013-01-18 MED ORDER — HYDROMORPHONE HCL PF 1 MG/ML IJ SOLN
INTRAMUSCULAR | Status: AC
  Filled 2013-01-18: qty 1

## 2013-01-18 MED ORDER — LACTATED RINGERS IV SOLN
INTRAVENOUS | Status: DC | PRN
  Administered 2013-01-18 (×2): via INTRAVENOUS

## 2013-01-18 MED ORDER — HYDROMORPHONE HCL PF 1 MG/ML IJ SOLN
0.5000 mg | INTRAMUSCULAR | Status: DC | PRN
  Administered 2013-01-18 – 2013-01-20 (×2): 1 mg via INTRAVENOUS
  Filled 2013-01-18 (×2): qty 1

## 2013-01-18 MED ORDER — OXYCODONE HCL 5 MG PO TABS
5.0000 mg | ORAL_TABLET | ORAL | Status: DC | PRN
  Administered 2013-01-19 – 2013-01-20 (×8): 10 mg via ORAL
  Filled 2013-01-18 (×9): qty 2

## 2013-01-18 MED ORDER — FENTANYL CITRATE 0.05 MG/ML IJ SOLN
INTRAMUSCULAR | Status: DC | PRN
  Administered 2013-01-18 (×4): 50 ug via INTRAVENOUS
  Administered 2013-01-18: 100 ug via INTRAVENOUS
  Administered 2013-01-18 (×2): 50 ug via INTRAVENOUS

## 2013-01-18 MED ORDER — PHENOL 1.4 % MT LIQD: 1.0000 | OROMUCOSAL | Status: DC | PRN

## 2013-01-18 MED ORDER — METHOCARBAMOL 500 MG PO TABS
ORAL_TABLET | ORAL | Status: AC
  Administered 2013-01-18: 500 mg via ORAL
  Filled 2013-01-18: qty 1

## 2013-01-18 MED ORDER — ONDANSETRON HCL 4 MG/2ML IJ SOLN
INTRAMUSCULAR | Status: DC | PRN
  Administered 2013-01-18: 4 mg via INTRAVENOUS

## 2013-01-18 MED ORDER — ACETAMINOPHEN 650 MG RE SUPP: 650.0000 mg | Freq: Four times a day (QID) | RECTAL | Status: DC | PRN

## 2013-01-18 MED ORDER — ASPIRIN EC 325 MG PO TBEC
325.0000 mg | DELAYED_RELEASE_TABLET | Freq: Two times a day (BID) | ORAL | Status: DC
  Administered 2013-01-19 – 2013-01-20 (×3): 325 mg via ORAL
  Filled 2013-01-18 (×5): qty 1

## 2013-01-18 MED ORDER — HYDROMORPHONE HCL PF 1 MG/ML IJ SOLN
INTRAMUSCULAR | Status: AC
  Administered 2013-01-18: 0.5 mg via INTRAVENOUS
  Filled 2013-01-18: qty 1

## 2013-01-18 MED ORDER — ROCURONIUM BROMIDE 100 MG/10ML IV SOLN
INTRAVENOUS | Status: DC | PRN
  Administered 2013-01-18: 10 mg via INTRAVENOUS
  Administered 2013-01-18: 40 mg via INTRAVENOUS

## 2013-01-18 MED ORDER — CHLORHEXIDINE GLUCONATE 4 % EX LIQD: 60.0000 mL | Freq: Once | CUTANEOUS | Status: DC

## 2013-01-18 MED ORDER — METHOCARBAMOL 500 MG PO TABS
500.0000 mg | ORAL_TABLET | Freq: Four times a day (QID) | ORAL | Status: DC | PRN
  Administered 2013-01-18 – 2013-01-20 (×6): 500 mg via ORAL
  Filled 2013-01-18 (×6): qty 1

## 2013-01-18 MED ORDER — ONDANSETRON HCL 4 MG PO TABS: 4.0000 mg | ORAL_TABLET | Freq: Four times a day (QID) | ORAL | Status: DC | PRN

## 2013-01-18 MED ORDER — ALUM & MAG HYDROXIDE-SIMETH 200-200-20 MG/5ML PO SUSP: 30.0000 mL | ORAL | Status: DC | PRN

## 2013-01-18 MED ORDER — ACETAMINOPHEN 10 MG/ML IV SOLN
INTRAVENOUS | Status: AC
  Filled 2013-01-18: qty 100

## 2013-01-18 MED ORDER — ONDANSETRON HCL 4 MG/2ML IJ SOLN: 4.0000 mg | Freq: Four times a day (QID) | INTRAMUSCULAR | Status: DC | PRN

## 2013-01-18 MED ORDER — ACETAMINOPHEN 325 MG PO TABS: 650.0000 mg | ORAL_TABLET | Freq: Four times a day (QID) | ORAL | Status: DC | PRN

## 2013-01-18 MED ORDER — ONDANSETRON HCL 4 MG/2ML IJ SOLN: 4.0000 mg | Freq: Once | INTRAMUSCULAR | Status: DC | PRN

## 2013-01-18 MED ORDER — OXYCODONE HCL 5 MG/5ML PO SOLN
5.0000 mg | Freq: Once | ORAL | Status: AC | PRN
Start: 2013-01-18 — End: 2013-01-18

## 2013-01-18 MED ORDER — 0.9 % SODIUM CHLORIDE (POUR BTL) OPTIME
TOPICAL | Status: DC | PRN
  Administered 2013-01-18: 1000 mL

## 2013-01-18 MED ORDER — ACETAMINOPHEN 10 MG/ML IV SOLN
1000.0000 mg | Freq: Four times a day (QID) | INTRAVENOUS | Status: DC
  Administered 2013-01-18: 1000 mg via INTRAVENOUS
  Filled 2013-01-18 (×2): qty 100

## 2013-01-18 MED ORDER — GLYCOPYRROLATE 0.2 MG/ML IJ SOLN
INTRAMUSCULAR | Status: DC | PRN
  Administered 2013-01-18: 0.4 mg via INTRAVENOUS

## 2013-01-18 MED ORDER — OXYCODONE HCL 5 MG PO TABS
ORAL_TABLET | ORAL | Status: AC
  Administered 2013-01-18: 5 mg via ORAL
  Filled 2013-01-18: qty 1

## 2013-01-18 MED ORDER — BISACODYL 10 MG RE SUPP: 10.0000 mg | Freq: Every day | RECTAL | Status: DC | PRN

## 2013-01-18 MED ORDER — MENTHOL 3 MG MT LOZG: 1.0000 | LOZENGE | OROMUCOSAL | Status: DC | PRN

## 2013-01-18 MED ORDER — NEOSTIGMINE METHYLSULFATE 1 MG/ML IJ SOLN
INTRAMUSCULAR | Status: DC | PRN
  Administered 2013-01-18: 3 mg via INTRAVENOUS

## 2013-01-18 MED ORDER — ACETAMINOPHEN 10 MG/ML IV SOLN
1000.0000 mg | Freq: Four times a day (QID) | INTRAVENOUS | Status: AC
  Administered 2013-01-18 – 2013-01-19 (×3): 1000 mg via INTRAVENOUS
  Filled 2013-01-18 (×3): qty 100

## 2013-01-18 MED ORDER — ZOLPIDEM TARTRATE 5 MG PO TABS: 5.0000 mg | ORAL_TABLET | Freq: Every evening | ORAL | Status: DC | PRN

## 2013-01-18 MED ORDER — FERROUS SULFATE 325 (65 FE) MG PO TABS
325.0000 mg | ORAL_TABLET | Freq: Every day | ORAL | Status: DC
  Administered 2013-01-19 – 2013-01-20 (×2): 325 mg via ORAL
  Filled 2013-01-18 (×3): qty 1

## 2013-01-18 MED ORDER — HYDROMORPHONE HCL PF 1 MG/ML IJ SOLN
0.2500 mg | INTRAMUSCULAR | Status: DC | PRN
  Administered 2013-01-18: 0.5 mg via INTRAVENOUS

## 2013-01-18 MED ORDER — HYDROMORPHONE HCL PF 1 MG/ML IJ SOLN: 0.2500 mg | INTRAMUSCULAR | Status: DC | PRN

## 2013-01-18 MED ORDER — LIDOCAINE HCL 4 % MT SOLN
OROMUCOSAL | Status: DC | PRN
  Administered 2013-01-18: 4 mL via TOPICAL

## 2013-01-18 MED ORDER — PROPOFOL 10 MG/ML IV BOLUS
INTRAVENOUS | Status: DC | PRN
  Administered 2013-01-18: 100 mg via INTRAVENOUS

## 2013-01-18 MED ORDER — DOCUSATE SODIUM 100 MG PO CAPS
100.0000 mg | ORAL_CAPSULE | Freq: Two times a day (BID) | ORAL | Status: DC
  Administered 2013-01-18 – 2013-01-20 (×4): 100 mg via ORAL
  Filled 2013-01-18 (×6): qty 1

## 2013-01-18 MED ORDER — LIDOCAINE HCL (CARDIAC) 20 MG/ML IV SOLN
INTRAVENOUS | Status: DC | PRN
  Administered 2013-01-18: 40 mg via INTRAVENOUS

## 2013-01-18 SURGICAL SUPPLY — 53 items
BLADE SAW SGTL 18X1.27X75 (BLADE) ×2 IMPLANT
BLADE SURG ROTATE 9660 (MISCELLANEOUS) IMPLANT
CELLS DAT CNTRL 66122 CELL SVR (MISCELLANEOUS) ×1 IMPLANT
CLOTH BEACON ORANGE TIMEOUT ST (SAFETY) ×2 IMPLANT
COVER BACK TABLE 24X17X13 BIG (DRAPES) IMPLANT
COVER SURGICAL LIGHT HANDLE (MISCELLANEOUS) ×2 IMPLANT
DRAPE C-ARM 42X72 X-RAY (DRAPES) ×2 IMPLANT
DRAPE STERI IOBAN 125X83 (DRAPES) ×2 IMPLANT
DRAPE U-SHAPE 47X51 STRL (DRAPES) ×6 IMPLANT
DRSG AQUACEL AG ADV 3.5X10 (GAUZE/BANDAGES/DRESSINGS) ×1 IMPLANT
DRSG MEPILEX BORDER 4X12 (GAUZE/BANDAGES/DRESSINGS) IMPLANT
DRSG MEPILEX BORDER 4X8 (GAUZE/BANDAGES/DRESSINGS) ×2 IMPLANT
DURAPREP 26ML APPLICATOR (WOUND CARE) ×2 IMPLANT
ELECT BLADE 4.0 EZ CLEAN MEGAD (MISCELLANEOUS)
ELECT BLADE TIP CTD 4 INCH (ELECTRODE) ×2 IMPLANT
ELECT CAUTERY BLADE 6.4 (BLADE) ×2 IMPLANT
ELECT REM PT RETURN 9FT ADLT (ELECTROSURGICAL) ×2
ELECTRODE BLDE 4.0 EZ CLN MEGD (MISCELLANEOUS) IMPLANT
ELECTRODE REM PT RTRN 9FT ADLT (ELECTROSURGICAL) ×1 IMPLANT
FACESHIELD LNG OPTICON STERILE (SAFETY) ×4 IMPLANT
GAUZE XEROFORM 1X8 LF (GAUZE/BANDAGES/DRESSINGS) ×2 IMPLANT
GLOVE BIO SURGEON STRL SZ8 (GLOVE) ×2 IMPLANT
GLOVE BIO SURGEON STRL SZ8.5 (GLOVE) ×2 IMPLANT
GLOVE BIOGEL PI IND STRL 8 (GLOVE) ×1 IMPLANT
GLOVE BIOGEL PI IND STRL 8.5 (GLOVE) ×1 IMPLANT
GLOVE BIOGEL PI INDICATOR 8 (GLOVE) ×1
GLOVE BIOGEL PI INDICATOR 8.5 (GLOVE) ×1
GOWN PREVENTION PLUS LG XLONG (DISPOSABLE) IMPLANT
GOWN STRL NON-REIN LRG LVL3 (GOWN DISPOSABLE) ×4 IMPLANT
GOWN STRL REIN XL XLG (GOWN DISPOSABLE) ×2 IMPLANT
KIT BASIN OR (CUSTOM PROCEDURE TRAY) ×2 IMPLANT
KIT ROOM TURNOVER OR (KITS) ×2 IMPLANT
MANIFOLD NEPTUNE II (INSTRUMENTS) ×2 IMPLANT
NS IRRIG 1000ML POUR BTL (IV SOLUTION) ×2 IMPLANT
PACK TOTAL JOINT (CUSTOM PROCEDURE TRAY) ×2 IMPLANT
PAD ARMBOARD 7.5X6 YLW CONV (MISCELLANEOUS) ×4 IMPLANT
RETRACTOR WND ALEXIS 18 MED (MISCELLANEOUS) ×1 IMPLANT
RTRCTR WOUND ALEXIS 18CM MED (MISCELLANEOUS) ×2
SPONGE LAP 18X18 X RAY DECT (DISPOSABLE) ×2 IMPLANT
SPONGE LAP 4X18 X RAY DECT (DISPOSABLE) IMPLANT
STAPLER VISISTAT 35W (STAPLE) ×2 IMPLANT
SUT ETHIBOND NAB CT1 #1 30IN (SUTURE) ×4 IMPLANT
SUT VIC AB 0 CT1 27 (SUTURE)
SUT VIC AB 0 CT1 27XBRD ANBCTR (SUTURE) IMPLANT
SUT VIC AB 1 CT1 27 (SUTURE) ×2
SUT VIC AB 1 CT1 27XBRD ANBCTR (SUTURE) ×1 IMPLANT
SUT VIC AB 2-0 CT1 27 (SUTURE) ×2
SUT VIC AB 2-0 CT1 TAPERPNT 27 (SUTURE) ×1 IMPLANT
SUT VLOC 180 0 24IN GS25 (SUTURE) ×2 IMPLANT
TOWEL OR 17X24 6PK STRL BLUE (TOWEL DISPOSABLE) ×2 IMPLANT
TOWEL OR 17X26 10 PK STRL BLUE (TOWEL DISPOSABLE) ×4 IMPLANT
TRAY FOLEY CATH 14FR (SET/KITS/TRAYS/PACK) IMPLANT
WATER STERILE IRR 1000ML POUR (IV SOLUTION) ×4 IMPLANT

## 2013-01-18 NOTE — Transfer of Care (Signed)
Immediate Anesthesia Transfer of Care Note  Patient: Cassandra Case  Procedure(s) Performed: Procedure(s): TOTAL HIP ARTHROPLASTY ANTERIOR APPROACH (Left)  Patient Location: PACU  Anesthesia Type:General  Level of Consciousness: awake, alert , oriented and patient cooperative  Airway & Oxygen Therapy: Patient Spontanous Breathing and Patient connected to nasal cannula oxygen  Post-op Assessment: Report given to PACU RN and Post -op Vital signs reviewed and stable  Post vital signs: Reviewed and stable  Complications: No apparent anesthesia complications

## 2013-01-18 NOTE — Plan of Care (Signed)
Problem: Consults Goal: Diagnosis- Total Joint Replacement Primary Total Hip Left     

## 2013-01-18 NOTE — Interval H&P Note (Signed)
History and Physical Interval Note:  01/18/2013 9:11 AM  Cassandra Case  has presented today for surgery, with the diagnosis of LEFT HIP DEGENERATIVE JOINT DISEASE  The various methods of treatment have been discussed with the patient and family. After consideration of risks, benefits and other options for treatment, the patient has consented to  Procedure(s): TOTAL HIP ARTHROPLASTY ANTERIOR APPROACH (Left) as a surgical intervention .  The patient's history has been reviewed, patient examined, no change in status, stable for surgery.  I have reviewed the patient's chart and labs.  Questions were answered to the patient's satisfaction.     Saskia Simerson G

## 2013-01-18 NOTE — Op Note (Signed)
PRE-OP DIAGNOSIS:  LEFT HIP DEGENERATIVE JOINT DISEASE POST-OP DIAGNOSIS:  LEFT HIP DEGENERATIVE JOINT DISEASE PROCEDURE:  Procedure(s): LEFT TOTAL HIP ARTHROPLASTY ANTERIOR APPROACH ANESTHESIA:  General SURGEON:  Marcene Corning MD ASSISTANT:  Lindwood Qua PA-C   INDICATIONS FOR PROCEDURE:  The patient is a 62 y.o. female with a long history of a painful hip.  This has persisted despite multiple conservative measures.  The patient has persisted with pain and dysfunction making rest and activity difficult.  A total hip replacement is offered as surgical treatment.  Informed operative consent was obtained after discussion of possible complications including reaction to anesthesia, infection, neurovascular injury, dislocation, DVT, PE, and death.  The importance of the postoperative rehab program to optimize result was stressed with the patient.  SUMMARY OF FINDINGS AND PROCEDURE:  Under general anesthesia through a anterior approach an the Hana table a right THR was performed.  The patient had severe degenerative change and good bone quality.  We used DePuy components to replace the hip and these were size KA12 Corail femur capped with a +1 ceramic 32mm hip ball.  On the acetabular side we used a size 48mm Gription shell with a plus 4 neutral polyethylene liner.  We did use a hole eliminator.  Bryna Colander assisted throughout and was invaluable to the completion of the case in that he helped position and retract while I performed the procedure.  He also closed simultaneously to help minimize OR time.  I used fluoroscopy throughout the case to check position of components and leg lengths and read all these views myself.  DESCRIPTION OF PROCEDURE:  The patient was taken to the OR suite where general anesthetic was applied.  The patient was then positioned on the Hana table supine.  All bony prominences were appropriately padded.  Prep and drape was then performed in normal sterile fashion.  The patient  was given kefzol preoperative antibiotic and an appropriate time out was performed.  We then took an anterior approach to the right hip.  Dissection was taken through adipose to the tensor fascia lata fascia.  This structure was incised longitudinally and we dissected in the intermuscular interval just medial to this muscle.  Cobra retractors were placed superior and inferior to the femoral neck superficial to the capsule.  A capsular incision was then made and the retractors were placed along the femoral neck.  Xray was brought in to get a good level for the femoral neck cut which was made with an oscillating saw and osteotome.  The femoral head was removed with a corkscrew.  The acetabulum was exposed and some labral tissues were excised. Reaming was taken to the inside wall of the pelvis and sequentially up to 1 mm smaller than the actual component.  A trial of components was done and then the aforementioned acetabular shell was placed in appropriate tilt and anteversion confirmed by fluoroscopy. The liner was placed along with the hole eliminator and attention was turned to the femur.  The leg was brought down and over into adduction and the elevator bar was used to raise the femur up gently in the wound.  The piriformis was released with care taken to preserve the obturator internus attachment and all of the posterior capsule. The femur was reamed and then broached to the appropriate size.  A trial reduction was done and the aforementioned head and neck assembly gave [place] the best stability in extension with external rotation.  Leg lengths were felt to be about equal by  fluoroscopic exam.  The trial components were removed and the wound irrigated.  We then placed the femoral component in appropriate anteversion.  The head was applied to a dry stem neck and the hip again reduced.  It was again stable in the aforementioned position.  The would was irrigated again followed by re-approximation of anterior capsule  with ethibond suture. Tensor fascia was repaired with V-loc suture  followed by subcutaneous closure with #O and #2 undyed vicryl.  Skin was closed with staples followed by a sterile dressing.  EBL and IOF can be obtained from anesthesia records.  DISPOSITION:  The patient was extubated in the OR and taken to PACU in stable condition to be admitted to the Orthopedic Surgery for appropriate post-op care to include perioperative antibiotics and DVT prophylaxis.

## 2013-01-18 NOTE — Preoperative (Signed)
Beta Blockers   Reason not to administer Beta Blockers:Not Applicable 

## 2013-01-18 NOTE — Progress Notes (Signed)
UR COMPLETED  

## 2013-01-18 NOTE — Anesthesia Procedure Notes (Signed)
Procedure Name: Intubation Date/Time: 01/18/2013 10:43 AM Performed by: Lovie Chol Pre-anesthesia Checklist: Patient identified, Emergency Drugs available, Suction available, Patient being monitored and Timeout performed Patient Re-evaluated:Patient Re-evaluated prior to inductionOxygen Delivery Method: Circle system utilized Preoxygenation: Pre-oxygenation with 100% oxygen Intubation Type: IV induction Ventilation: Mask ventilation without difficulty Laryngoscope Size: Miller and 2 Grade View: Grade I Tube type: Oral Tube size: 7.0 mm Number of attempts: 1 Airway Equipment and Method: Stylet and LTA kit utilized Placement Confirmation: ETT inserted through vocal cords under direct vision,  positive ETCO2,  CO2 detector and breath sounds checked- equal and bilateral Secured at: 20 cm Tube secured with: Tape Dental Injury: Teeth and Oropharynx as per pre-operative assessment

## 2013-01-18 NOTE — Progress Notes (Signed)
Orthopedic Tech Progress Note Patient Details:  Cassandra Case [birth year] [id number] Applied overhead frame to bed.     Jennye Moccasin 01/18/2013, 5:10 PM

## 2013-01-18 NOTE — Anesthesia Postprocedure Evaluation (Signed)
  Anesthesia Post-op Note  Patient: Cassandra Case  Procedure(s) Performed: Procedure(s): TOTAL HIP ARTHROPLASTY ANTERIOR APPROACH (Left)  Patient Location: PACU  Anesthesia Type:General  Level of Consciousness: awake  Airway and Oxygen Therapy: Patient Spontanous Breathing and Patient connected to nasal cannula oxygen  Post-op Pain: mild  Post-op Assessment: Post-op Vital signs reviewed, Patient's Cardiovascular Status Stable, Respiratory Function Stable, Patent Airway and No signs of Nausea or vomiting  Post-op Vital Signs: Reviewed and stable  Complications: No apparent anesthesia complications

## 2013-01-18 NOTE — Anesthesia Preprocedure Evaluation (Addendum)
Anesthesia Evaluation  Patient identified by MRN, date of birth, ID band Patient awake    Reviewed: Allergy & Precautions, H&P , NPO status , Patient's Chart, lab work & pertinent test results  Airway Mallampati: I TM Distance: >3 FB Neck ROM: full    Dental  (+) Edentulous Upper, Edentulous Lower and Dental Advisory Given   Pulmonary          Cardiovascular hypertension, Rhythm:regular Rate:Normal     Neuro/Psych    GI/Hepatic   Endo/Other    Renal/GU      Musculoskeletal   Abdominal   Peds  Hematology   Anesthesia Other Findings   Reproductive/Obstetrics                         Anesthesia Physical Anesthesia Plan  ASA: II  Anesthesia Plan: General   Post-op Pain Management:    Induction: Intravenous  Airway Management Planned: Oral ETT  Additional Equipment:   Intra-op Plan:   Post-operative Plan: Extubation in OR  Informed Consent: I have reviewed the patients History and Physical, chart, labs and discussed the procedure including the risks, benefits and alternatives for the proposed anesthesia with the patient or authorized representative who has indicated his/her understanding and acceptance.     Plan Discussed with: CRNA, Anesthesiologist and Surgeon  Anesthesia Plan Comments:         Anesthesia Quick Evaluation

## 2013-01-19 ENCOUNTER — Encounter (HOSPITAL_COMMUNITY): Payer: Self-pay | Admitting: Orthopaedic Surgery

## 2013-01-19 LAB — CBC
HCT: 27.6 % — ABNORMAL LOW (ref 36.0–46.0)
MCV: 86.3 fL (ref 78.0–100.0)
Platelets: 285 10*3/uL (ref 150–400)
RBC: 3.2 MIL/uL — ABNORMAL LOW (ref 3.87–5.11)
WBC: 4.4 10*3/uL (ref 4.0–10.5)

## 2013-01-19 LAB — BASIC METABOLIC PANEL
CO2: 29 mEq/L (ref 19–32)
Chloride: 99 mEq/L (ref 96–112)
Creatinine, Ser: 0.83 mg/dL (ref 0.50–1.10)

## 2013-01-19 NOTE — Care Management Note (Signed)
CARE MANAGEMENT NOTE 01/19/2013  Patient:  TAELYR, JANTZ   Account Number:  [id number]  Date Initiated:  01/19/2013  Documentation initiated by:  Vance Peper  Subjective/Objective Assessment:   62 yr old female s/p left total hip arhtroplasty     Action/Plan:   CM spoke with patient and family concerning home health and DME needs at discharge.Choice offered. Patient preoperatively setup with [facility], no changes. Patient has rolling walker and 3in1.Has family support at discharge.   Anticipated DC Date:  01/20/2013   Anticipated DC Plan:  HOME W HOME HEALTH SERVICES      DC Planning Services  CM consult      [facility] Choice  HOME HEALTH   Choice offered to / List presented to:  C-1 Patient        HH arranged  HH-2 PT      [facility] agency  [facility]   Status of service:  Completed, signed off Medicare Important Message given?   (If response is "NO", the following Medicare IM given date fields will be blank) Date Medicare IM given:   Date Additional Medicare IM given:    Discharge Disposition:  HOME W HOME HEALTH SERVICES  Per UR Regulation:    If discussed at Long Length of Stay Meetings, dates discussed:    Comments:

## 2013-01-19 NOTE — Progress Notes (Signed)
Physical Therapy Treatment Patient Details Name: Cassandra Case MRN: [id number] DOB: 11-06-1950 Today's Date: 01/19/2013 Time: 8295-6213 PT Time Calculation (min): 24 min  PT Assessment / Plan / Recommendation Comments on Treatment Session  Pt moving well this afternoon. C/o 8/10 pain with amb. pt cont to require min A for bed mobility to advance L LE onto and off EOB secondary to pain. Pt will cont to benefit from skilled PT to improve mobility and increase independence with transfers and gait.  Will plan to attempt stair amb in morning session, anticipating possible D/C home with family once medically ready.     Follow Up Recommendations  Home health PT;Supervision/Assistance - 24 hour     Does the patient have the potential to tolerate intense rehabilitation     Barriers to Discharge        Equipment Recommendations  None recommended by PT    Recommendations for Other Services    Frequency 7X/week   Plan Discharge plan remains appropriate;Frequency remains appropriate    Precautions / Restrictions Precautions Precautions: Anterior Hip;Fall Restrictions Weight Bearing Restrictions: Yes LLE Weight Bearing: Weight bearing as tolerated   Pertinent Vitals/Pain 8/10 with amb; pt given ice pack for L hip to reduce pain; pt premedicated.     Mobility  Bed Mobility Bed Mobility: Supine to Sit;Sitting - Scoot to [place] of Bed;Sit to Supine Supine to Sit: 4: Min assist;With rails Sitting - Scoot to [place] of Bed: 4: Min guard Sit to Supine: 4: Min assist;Other (comment) (for L LE) Details for Bed Mobility Assistance: pt required min A to advance L LE to EOB and onto bed secondary to pain  Transfers Transfers: Sit to Stand;Stand to Sit Sit to Stand: 4: Min guard;From bed;With upper extremity assist Stand to Sit: 4: Min guard;With upper extremity assist;To bed Details for Transfer Assistance: vc's for hand placement and to control descent to bed with UEs   Ambulation/Gait Ambulation/Gait Assistance: 4: Min guard Ambulation Distance (Feet): 60 Feet Assistive device: Rolling walker Ambulation/Gait Assistance Details: cues for gt and RW sequencing. pt demo increased ability to weightshift onto L LE this afternoon; pt requires increased time to complete tasks secondary to pain; c/o 8/10 pain with amb. vc's for upright posture <25% of time  Gait Pattern: Step-to pattern;Decreased stride length;Wide base of support Gait velocity: decreased Stairs: No Wheelchair Mobility Wheelchair Mobility: No    Exercises Total Joint Exercises Ankle Circles/Pumps: AROM;Both;10 reps Other Exercises Other Exercises: pt deferred additional exercises this afternoon; wanted to visit with family and rest   PT Diagnosis:    PT Problem List:   PT Treatment Interventions:     PT Goals Acute Rehab PT Goals PT Goal Formulation: With patient Time For Goal Achievement: 01/26/13 Potential to Achieve Goals: Good PT Goal: Supine/Side to Sit - Progress: Progressing toward goal PT Goal: Sit to Supine/Side - Progress: Progressing toward goal PT Goal: Sit to Stand - Progress: Progressing toward goal PT Goal: Stand to Sit - Progress: Progressing toward goal PT Transfer Goal: Bed to Chair/Chair to Bed - Progress: Progressing toward goal PT Goal: Ambulate - Progress: Progressing toward goal PT Goal: Perform Home Exercise Program - Progress: Progressing toward goal  Visit Information  Last PT Received On: 01/19/13 Assistance Needed: +1    Subjective Data  Subjective: "I just went to the bathroom" Pt agreeable to walk with max encouragement  Patient Stated Goal: to go home with husband    Cognition  Cognition Overall Cognitive Status: Appears within functional limits  for tasks assessed/performed Arousal/Alertness: Awake/alert Orientation Level: Appears intact for tasks assessed Behavior During Session: [facility] for tasks performed    Balance  Balance Balance Assessed:  No  End of Session PT - End of Session Equipment Utilized During Treatment: Gait belt Activity Tolerance: Patient tolerated treatment well Patient left: in bed;with call bell/phone within reach;with family/visitor present Nurse Communication: Mobility status   GP     Donell Sievert , [place] [phone]  01/19/2013, 3:01 PM

## 2013-01-19 NOTE — Evaluation (Signed)
Occupational Therapy Evaluation Patient Details Name: Cassandra Case MRN: [id number] DOB: 04-Oct-1951 Today's Date: 01/19/2013 Time: 4098-1191 OT Time Calculation (min): 24 min  OT Assessment / Plan / Recommendation Clinical Impression  Pt is recovering from her second anterior hip replacement, her first was on the R hip in October of 2013.  Pt has all necessary equipment at home for ADL and mobility.  Will have 24 hour care of her family.  No further OT needs.    OT Assessment  Patient does not need any further OT services    Follow Up Recommendations  No OT follow up;Supervision/Assistance - 24 hour    Barriers to Discharge      Equipment Recommendations  None recommended by OT    Recommendations for Other Services    Frequency       Precautions / Restrictions Precautions Precautions: Anterior Hip;Fall Precaution Booklet Issued: Yes (comment) Restrictions Weight Bearing Restrictions: Yes LLE Weight Bearing: Weight bearing as tolerated   Pertinent Vitals/Pain 5/10 L hip with movement, iced, premedicated, repositioned    ADL  Eating/Feeding: Independent Where Assessed - Eating/Feeding: Bed level Grooming: Wash/dry hands;Min guard Where Assessed - Grooming: Unsupported standing Upper Body Bathing: Set up Where Assessed - Upper Body Bathing: Unsupported sitting Lower Body Bathing: Minimal assistance Where Assessed - Lower Body Bathing: Unsupported sitting;Supported sit to stand Upper Body Dressing: Set up Where Assessed - Upper Body Dressing: Unsupported sitting Lower Body Dressing: Minimal assistance Where Assessed - Lower Body Dressing: Unsupported sitting;Supported sit to stand Toilet Transfer: Min Pension scheme manager Method: Sit to Barista: Comfort height toilet Toileting - Clothing Manipulation and Hygiene: Supervision/safety Where Assessed - Engineer, mining and Hygiene: Sit to stand from 3-in-1 or toilet Equipment Used:  Rolling walker Transfers/Ambulation Related to ADLs: min guard assist and extra time with RW ADL Comments: Pt has a hip kit at home, reviewed use of items.    OT Diagnosis:    OT Problem List:   OT Treatment Interventions:     OT Goals    Visit Information  Last OT Received On: 01/19/13 Assistance Needed: +1    Subjective Data  Subjective: "I bought all that equipment with my last surgery." Patient Stated Goal: home with assist of niece, nephew, husband   Prior Functioning     Home Living Lives With: Spouse;Family Available Help at Discharge: Available 24 hours/day;Family Type of Home: House Home Access: Stairs to enter [employer] of Steps: 3 Entrance Stairs-Rails: None Home Layout: Two level;Able to live on main level with bedroom/bathroom;Full bath on main level Bathroom Shower/Tub: Health visitor: Standard Bathroom Accessibility: Yes How Accessible: Accessible via walker Home Adaptive Equipment: Bedside commode/3-in-1;Walker - rolling;Shower chair with back;Long-handled shoehorn;Long-handled sponge;Sock aid;Reacher Prior Function Level of Independence: Independent with assistive device(s) Able to Take Stairs?: Yes Driving: Yes Vocation: Full time employment Communication Communication: No difficulties Dominant Hand: Right         Vision/Perception Vision - History Baseline Vision: Wears glasses only for reading Patient Visual Report: No change from baseline   Cognition  Cognition Overall Cognitive Status: Appears within functional limits for tasks assessed/performed Arousal/Alertness: Awake/alert Orientation Level: Appears intact for tasks assessed Behavior During Session: [facility] for tasks performed    Extremity/Trunk Assessment Right Upper Extremity Assessment RUE ROM/Strength/Tone: Within functional levels RUE Coordination: WFL - gross/fine motor Left Upper Extremity Assessment LUE ROM/Strength/Tone: Within functional  levels LUE Coordination: WFL - gross/fine motor     Mobility Bed Mobility Bed Mobility: Supine  to Sit;Sitting - Scoot to [place] of Bed;Sit to Supine Supine to Sit: 4: Min assist;With rails Sitting - Scoot to [place] of Bed: 4: Min guard Sit to Supine: 4: Min assist (for LEs) Details for Bed Mobility Assistance: pt required min A to advance L LE to EOB secondary to pain with movement. Transfers Transfers: Sit to Stand;Stand to Sit Sit to Stand: 4: Min guard;With upper extremity assist;From bed;From toilet Stand to Sit: 4: Min guard;With upper extremity assist;With armrests;To bed;To toilet Details for Transfer Assistance: vc's for hand placement and safety; required min G to steady     Exercise    Balance Balance Balance Assessed: No   End of Session OT - End of Session Activity Tolerance: Patient limited by pain Patient left: in bed;with call bell/phone within reach;with family/visitor present  GO     Evern Bio 01/19/2013, 11:45 AM [phone]

## 2013-01-19 NOTE — Evaluation (Signed)
Physical Therapy Evaluation Patient Details Name: Cassandra Case MRN: [id number] DOB: 1951-09-01 Today's Date: 01/19/2013 Time: 4098-1191 PT Time Calculation (min): 33 min  PT Assessment / Plan / Recommendation Clinical Impression  Pt is a pleasant 62 y.o. female s/p L THA POD#1. Pt moving well this morning; requires increased time to complete tasks secondary to pain this morning. Pt will have husband and family for care upon acute D/C; plan for HHPT at this time upon D/C. Will cont to f/u with pt to increase mobility and indepence with gt.     PT Assessment  Patient needs continued PT services    Follow Up Recommendations  Home health PT;Supervision/Assistance - 24 hour    Does the patient have the potential to tolerate intense rehabilitation      Barriers to Discharge        Equipment Recommendations  None recommended by PT    Recommendations for Other Services OT consult   Frequency 7X/week    Precautions / Restrictions Precautions Precautions: Anterior Hip;Fall Precaution Booklet Issued: Yes (comment) Restrictions Weight Bearing Restrictions: Yes LLE Weight Bearing: Weight bearing as tolerated   Pertinent Vitals/Pain 5/10 with activity; pt premedicated and repositioned      Mobility  Bed Mobility Bed Mobility: Supine to Sit;Sitting - Scoot to Edge of Bed Supine to Sit: 4: Min assist;With rails Sitting - Scoot to [place] of Bed: 4: Min guard Details for Bed Mobility Assistance: pt required min A to advance L LE to EOB secondary to pain with movement. Transfers Transfers: Sit to Stand;Stand to Sit Sit to Stand: 4: Min guard;From bed;With upper extremity assist Stand to Sit: 4: Min guard;With upper extremity assist;With armrests;To chair/3-in-1 Details for Transfer Assistance: vc's for hand placement and safety; required min G to steady Ambulation/Gait Ambulation/Gait Assistance: 4: Min guard Ambulation Distance (Feet): 20 Feet (x2) Assistive device: Rolling  walker Ambulation/Gait Assistance Details: cues for gt sequencing and RW safety; pt encouraged to equally weight shift onto L LE; vc's for upright posture; pt requires increased time to complete tasks.  Gait Pattern: Step-to pattern;Decreased stance time - left;Decreased step length - right;Antalgic;Trunk flexed Gait velocity: decreased Stairs: No Wheelchair Mobility Wheelchair Mobility: No    Exercises Total Joint Exercises Ankle Circles/Pumps: AROM;Both;10 reps;Seated Quad Sets: AROM;Both;10 reps;Seated Heel Slides: AAROM;Left;10 reps;Seated   PT Diagnosis: Difficulty walking;Acute pain  PT Problem List: Decreased strength;Decreased range of motion;Decreased activity tolerance;Decreased balance;Decreased mobility;Decreased knowledge of use of DME;Decreased safety awareness;Decreased knowledge of precautions;Pain PT Treatment Interventions: DME instruction;Gait training;Stair training;Functional mobility training;Therapeutic activities;Therapeutic exercise;Balance training;Neuromuscular re-education;Patient/family education   PT Goals Acute Rehab PT Goals PT Goal Formulation: With patient Time For Goal Achievement: 01/26/13 Potential to Achieve Goals: Good Pt will go Supine/Side to Sit: with modified independence PT Goal: Supine/Side to Sit - Progress: Goal set today Pt will go Sit to Supine/Side: with modified independence PT Goal: Sit to Supine/Side - Progress: Goal set today Pt will go Sit to Stand: with modified independence PT Goal: Sit to Stand - Progress: Goal set today Pt will go Stand to Sit: with modified independence PT Goal: Stand to Sit - Progress: Goal set today Pt will Transfer Bed to Chair/Chair to Bed: with supervision PT Transfer Goal: Bed to Chair/Chair to Bed - Progress: Goal set today Pt will Ambulate: >150 feet;with supervision;with rolling walker PT Goal: Ambulate - Progress: Goal set today Pt will Go Up / Down Stairs: 3-5 stairs;with rolling walker PT Goal:  Up/Down Stairs - Progress: Goal set today Pt will Perform  Home Exercise Program: Independently PT Goal: Perform Home Exercise Program - Progress: Goal set today  Visit Information  Last PT Received On: 01/19/13    Subjective Data  Subjective: I am doing ok this morning. Patient Stated Goal: to go home with husband    Prior Functioning  Home Living Lives With: Spouse;Family Available Help at Discharge: Available 24 hours/day;Family Type of Home: House Home Access: Stairs to enter [employer] of Steps: 3 Entrance Stairs-Rails: None Home Layout: Two level;Able to live on main level with bedroom/bathroom;Full bath on main level Bathroom Shower/Tub: Health visitor: Standard Bathroom Accessibility: Yes How Accessible: Accessible via walker Home Adaptive Equipment: Bedside commode/3-in-1;Walker - rolling Prior Function Level of Independence: Independent with assistive device(s) (amb with SPC or RW PRN) Able to Take Stairs?: Yes Driving: Yes Vocation: Full time employment Communication Communication: No difficulties Dominant Hand: Right    Cognition  Cognition Overall Cognitive Status: Appears within functional limits for tasks assessed/performed Arousal/Alertness: Awake/alert Orientation Level: Appears intact for tasks assessed Behavior During Session: [facility] for tasks performed    Extremity/Trunk Assessment Right Lower Extremity Assessment RLE ROM/Strength/Tone: Within functional levels RLE Sensation: WFL - Light Touch Left Lower Extremity Assessment LLE ROM/Strength/Tone: Unable to fully assess;Due to pain LLE Sensation: WFL - Light Touch   Balance Balance Balance Assessed: No  End of Session PT - End of Session Equipment Utilized During Treatment: Gait belt Activity Tolerance: Patient tolerated treatment well Patient left: in chair;with call bell/phone within reach;with family/visitor present Nurse Communication: Mobility status  GP      Donell Sievert, [place] [phone] 01/19/2013, 9:29 AM

## 2013-01-19 NOTE — Progress Notes (Addendum)
Subjective: 1 Day Post-Op Procedure(s) (LRB): TOTAL HIP ARTHROPLASTY ANTERIOR APPROACH (Left) Doing well Activity level:  wbat l hip Diet tolerance:  ok Voiding:  Ok foley out Patient reports pain as 3 on 0-10 scale.    Objective: Vital signs in last 24 hours: Temp:  [97.3 F (36.3 C)-98.4 F (36.9 C)] 98.4 F (36.9 C) (04/09 0606) Pulse Rate:  [54-78] 75 (04/09 0606) Resp:  [10-20] 18 (04/09 0606) BP: (111-165)/(59-85) 124/65 mmHg (04/09 0606) SpO2:  [99 %-100 %] 99 % (04/09 0606) FiO2 (%):  [28 %] 28 % (04/08 1445)  Labs:  Recent Labs  01/18/13 0841 01/19/13 0610  HGB 13.3 9.3*    Recent Labs  01/18/13 0841 01/19/13 0610  WBC  --  4.4  RBC  --  3.20*  HCT 39.0 27.6*  PLT  --  285    Recent Labs  01/18/13 0841 01/19/13 0610  NA 141 136  K 3.2* 3.8  CL  --  99  CO2  --  29  BUN  --  7  CREATININE  --  0.83  GLUCOSE 102* 103*  CALCIUM  --  9.2   No results found for this basename: LABPT, INR,  in the last 72 hours  Physical Exam:  Neurologically intact ABD soft Neurovascular intact Sensation intact distally Intact pulses distally Dorsiflexion/Plantar flexion intact Incision: dressing C/D/I No cellulitis present Compartment soft  Assessment/Plan:  1 Day Post-Op Procedure(s) (LRB): TOTAL HIP ARTHROPLASTY ANTERIOR APPROACH (Left) Advance diet Up with therapy D/C IV fluids Plan for discharge tomorrow Asa 325 bid x 4 weeks  Prevent dvt and scd's wbat    Tayo Maute R 01/19/2013, 8:14 AM

## 2013-01-20 LAB — BASIC METABOLIC PANEL
BUN: 7 mg/dL (ref 6–23)
Calcium: 9.6 mg/dL (ref 8.4–10.5)
Creatinine, Ser: 0.86 mg/dL (ref 0.50–1.10)
GFR calc Af Amer: 84 mL/min — ABNORMAL LOW (ref >90)
GFR calc non Af Amer: 73 mL/min — ABNORMAL LOW (ref >90)
Glucose, Bld: 114 mg/dL — ABNORMAL HIGH (ref 70–99)
Potassium: 3.7 mEq/L (ref 3.5–5.1)

## 2013-01-20 LAB — CBC
HCT: 27 % — ABNORMAL LOW (ref 36.0–46.0)
MCH: 29.9 pg (ref 26.0–34.0)
MCHC: 34.4 g/dL (ref 30.0–36.0)
RDW: 13.3 % (ref 11.5–15.5)

## 2013-01-20 MED ORDER — HYDROCODONE-ACETAMINOPHEN 7.5-325 MG PO TABS: 1.0000 | ORAL_TABLET | Freq: Every day | ORAL | Status: DC

## 2013-01-20 MED ORDER — METHOCARBAMOL 500 MG PO TABS: 500.0000 mg | ORAL_TABLET | Freq: Three times a day (TID) | ORAL | Status: DC | PRN

## 2013-01-20 MED ORDER — FERROUS SULFATE 325 (65 FE) MG PO TABS: 325.0000 mg | ORAL_TABLET | Freq: Every day | ORAL | Status: DC

## 2013-01-20 MED ORDER — ASPIRIN 325 MG PO TBEC: 325.0000 mg | DELAYED_RELEASE_TABLET | Freq: Two times a day (BID) | ORAL | Status: DC

## 2013-01-20 NOTE — Progress Notes (Deleted)
Physical Therapy Treatment Patient Details Name: Cassandra Case MRN: [id number] DOB: 1951-08-29 Today's Date: 01/20/2013 Time: 0912-0959 PT Time Calculation (min): 47 min  PT Assessment / Plan / Recommendation Comments on Treatment Session  Patient complained of lightheadedness.  Asked about while ambulating.  Said lightheaded but wanted to continue.  Followed with chair.  Patient motivated.      Follow Up Recommendations  Home health PT;Supervision/Assistance - 24 hour     Does the patient have the potential to tolerate intense rehabilitation     Barriers to Discharge        Equipment Recommendations  None recommended by PT    Recommendations for Other Services    Frequency     Plan Discharge plan remains appropriate;Frequency remains appropriate    Precautions / Restrictions Precautions Precautions: Fall Precaution Booklet Issued: No Restrictions Weight Bearing Restrictions: Yes LLE Weight Bearing: Weight bearing as tolerated   Pertinent Vitals/Pain 6/10 L Hip pain.    Mobility  Bed Mobility Bed Mobility: Supine to Sit Supine to Sit: 4: Min guard Sitting - Scoot to Edge of Bed: 4: Min guard Details for Bed Mobility Assistance: Patient motivated and moved LLE by herself. Transfers Transfers: Sit to Stand;Stand to Sit Sit to Stand: 4: Min guard;From bed;With upper extremity assist Stand to Sit: 4: Min guard;With upper extremity assist;To chair/3-in-1 Details for Transfer Assistance: vc's for hand placement and to control descent to bed with UEs  Ambulation/Gait Ambulation/Gait Assistance: 4: Min guard Ambulation Distance (Feet): 75 Feet Ambulation/Gait Assistance Details: Cues to shift weight through extremities.  Asked about lightheadedness.  Patient stated slightly lightheaded.  Followed with chair after stairs. Gait Pattern: Step-to pattern;Decreased stride length Gait velocity: Decreased. Stairs: Yes Stairs Assistance: 4: Min guard Stairs Assistance Details  (indicate cue type and reason): Cues for sequencing and walker placement. Stair Management Technique: Backwards;With walker Number of Stairs: 3 Wheelchair Mobility Wheelchair Mobility: No    Exercises Total Joint Exercises Quad Sets: AROM;Left;10 reps;Seated Gluteal Sets: AROM;Both;10 reps Hip ABduction/ADduction: AAROM;Left;10 reps Long Arc Quad: AROM;Left;10 reps   PT Diagnosis:    PT Problem List:   PT Treatment Interventions:     PT Goals Acute Rehab PT Goals PT Goal: Supine/Side to Sit - Progress: Progressing toward goal PT Goal: Stand to Sit - Progress: Progressing toward goal PT Transfer Goal: Bed to Chair/Chair to Bed - Progress: Progressing toward goal PT Goal: Ambulate - Progress: Progressing toward goal PT Goal: Up/Down Stairs - Progress: Progressing toward goal PT Goal: Perform Home Exercise Program - Progress: Progressing toward goal  Visit Information  Last PT Received On: 01/20/13 Assistance Needed: +1    Subjective Data      Cognition  Cognition Overall Cognitive Status: Appears within functional limits for tasks assessed/performed Arousal/Alertness: Awake/alert Orientation Level: Appears intact for tasks assessed Behavior During Session: [facility] for tasks performed Cognition - Other Comments: Patient complained of lightheadedness.    Balance     End of Session PT - End of Session Equipment Utilized During Treatment: Gait belt Activity Tolerance: Patient tolerated treatment well Patient left: in chair;with call bell/phone within reach   GP     [facility], Jerett Odonohue JEAN SPTA 01/20/2013, 10:17 AM

## 2013-01-20 NOTE — Progress Notes (Signed)
Subjective: 2 Days Post-Op Procedure(s) (LRB): TOTAL HIP ARTHROPLASTY ANTERIOR APPROACH (Left) No c/o of dizziness. Did stairs with pt. Wants to go home today Activity level:  wbat Diet tolerance:  Ok  Voiding:  ok Patient reports pain as 3 on 0-10 scale.    Objective: Vital signs in last 24 hours: Temp:  [98.4 F (36.9 C)-98.6 F (37 C)] 98.6 F (37 C) (04/10 0607) Pulse Rate:  [73-79] 79 (04/10 0607) Resp:  [18] 18 (04/10 0607) BP: (122-129)/(60-67) 129/67 mmHg (04/10 0607) SpO2:  [97 %-100 %] 100 % (04/10 0607)  Labs:  Recent Labs  01/18/13 0841 01/19/13 0610 01/20/13 0625  HGB 13.3 9.3* 9.3*    Recent Labs  01/19/13 0610 01/20/13 0625  WBC 4.4 7.0  RBC 3.20* 3.11*  HCT 27.6* 27.0*  PLT 285 271    Recent Labs  01/19/13 0610 01/20/13 0625  NA 136 134*  K 3.8 3.7  CL 99 95*  CO2 29 31  BUN 7 7  CREATININE 0.83 0.86  GLUCOSE 103* 114*  CALCIUM 9.2 9.6   No results found for this basename: LABPT, INR,  in the last 72 hours  Physical Exam:  Neurologically intact ABD soft Neurovascular intact Sensation intact distally Intact pulses distally Dorsiflexion/Plantar flexion intact Incision: dressing C/D/I and no drainage No cellulitis present Compartment soft  Assessment/Plan:  2 Days Post-Op Procedure(s) (LRB): TOTAL HIP ARTHROPLASTY ANTERIOR APPROACH (Left) Advance diet Up with therapy D/C IV fluids Discharge home with home health today after pt ASA 325 bid x 4 weeks  RTO 2 weeks HH for pt    Arjan Strohm R 01/20/2013, 10:20 AM

## 2013-01-20 NOTE — Progress Notes (Signed)
Physical Therapy Treatment Patient Details Name: Cassandra Case MRN: [id number] DOB: November 06, 1950 Today's Date: 01/20/2013 Time: 0912-0959 PT Time Calculation (min): 47 min  PT Assessment / Plan / Recommendation Comments on Treatment Session  Patient complained of lightheadedness.  Asked about while ambulating.  Said lightheaded but wanted to continue.  Followed with chair.  Patient motivated.      Follow Up Recommendations  Home health PT;Supervision/Assistance - 24 hour     Does the patient have the potential to tolerate intense rehabilitation     Barriers to Discharge        Equipment Recommendations  None recommended by PT    Recommendations for Other Services    Frequency     Plan Discharge plan remains appropriate;Frequency remains appropriate    Precautions / Restrictions Precautions Precautions: Fall Precaution Booklet Issued: No Restrictions Weight Bearing Restrictions: Yes LLE Weight Bearing: Weight bearing as tolerated   Pertinent Vitals/Pain 6/10 L hip pain.    Mobility  Bed Mobility Bed Mobility: Supine to Sit Supine to Sit: 4: Min guard Sitting - Scoot to Edge of Bed: 4: Min guard Details for Bed Mobility Assistance: Patient motivated and moved LLE by herself. Transfers Transfers: Sit to Stand;Stand to Sit Sit to Stand: 4: Min guard;From bed;With upper extremity assist Stand to Sit: 4: Min guard;With upper extremity assist;To chair/3-in-1 Details for Transfer Assistance: vc's for hand placement and to control descent to bed with UEs  Ambulation/Gait Ambulation/Gait Assistance: 4: Min guard Ambulation Distance (Feet): 75 Feet Ambulation/Gait Assistance Details: Cues to shift weight through extremities.  Asked about lightheadedness.  Patient stated slightly lightheaded.  Followed with chair after stairs. Gait Pattern: Step-to pattern;Decreased stride length Gait velocity: Decreased. Stairs: Yes Stairs Assistance: 4: Min guard Stairs Assistance Details  (indicate cue type and reason): Cues for sequencing and walker placement. Stair Management Technique: Backwards;With walker Number of Stairs: 3 Wheelchair Mobility Wheelchair Mobility: No    Exercises Total Joint Exercises Quad Sets: AROM;Left;10 reps;Seated Gluteal Sets: AROM;Both;10 reps Hip ABduction/ADduction: AAROM;Left;10 reps Long Arc Quad: AROM;Left;10 reps   PT Diagnosis:    PT Problem List:   PT Treatment Interventions:     PT Goals Acute Rehab PT Goals PT Goal: Supine/Side to Sit - Progress: Progressing toward goal PT Goal: Stand to Sit - Progress: Progressing toward goal PT Transfer Goal: Bed to Chair/Chair to Bed - Progress: Progressing toward goal PT Goal: Ambulate - Progress: Progressing toward goal PT Goal: Up/Down Stairs - Progress: Progressing toward goal PT Goal: Perform Home Exercise Program - Progress: Progressing toward goal  Visit Information  Last PT Received On: 01/20/13 Assistance Needed: +1    Subjective Data      Cognition  Cognition Overall Cognitive Status: Appears within functional limits for tasks assessed/performed Arousal/Alertness: Awake/alert Orientation Level: Appears intact for tasks assessed Behavior During Session: [facility] for tasks performed Cognition - Other Comments: Patient complained of lightheadedness.    Balance     End of Session PT - End of Session Equipment Utilized During Treatment: Gait belt Activity Tolerance: Patient tolerated treatment well Patient left: in chair;with call bell/phone within reach   GP     [facility], Merian Wroe JEAN SPTA 01/20/2013, 10:19 AM

## 2013-01-20 NOTE — Discharge Summary (Signed)
Patient ID: SIOBAHN WORSLEY MRN: [id number] DOB/AGE: 1950/12/05 62 y.o.  Admit date: 01/18/2013 Discharge date: 01/20/2013  Admission Diagnoses:  Principal Problem:   OA (osteoarthritis) of hip   Discharge Diagnoses:  Same  Past Medical History  Diagnosis Date  . Hypertension     takes Maxzide daily  . Arthritis   . Joint pain   . Joint swelling   . Rheumatoid arthritis   . Chronic back pain     hx of buldging disc    Surgeries: Procedure(s): TOTAL HIP ARTHROPLASTY ANTERIOR APPROACH on 01/18/2013   Consultants:    Discharged Condition: Improved  Hospital Course: ADRIAHNA SHEARMAN is an 62 y.o. female who was admitted 01/18/2013 for operative treatment ofOA (osteoarthritis) of hip. Patient has severe unremitting pain that affects sleep, daily activities, and work/hobbies. After pre-op clearance the patient was taken to the operating room on 01/18/2013 and underwent  Procedure(s): TOTAL HIP ARTHROPLASTY ANTERIOR APPROACH.    Patient was given perioperative antibiotics: Anti-infectives   Start     Dose/Rate Route Frequency Ordered Stop   01/18/13 1600  ceFAZolin (ANCEF) IVPB 2 g/50 mL premix     2 g 100 mL/hr over 30 Minutes Intravenous Every 6 hours 01/18/13 1444 01/18/13 2220   01/18/13 0600  ceFAZolin (ANCEF) 3 g in dextrose 5 % 50 mL IVPB     3 g 160 mL/hr over 30 Minutes Intravenous On call to O.R. 01/17/13 1457 01/18/13 0955       Patient was given sequential compression devices, early ambulation, and chemoprophylaxis to prevent DVT. Will cont ASA 325 BID x 4 weeks to prevent dvt  Patient benefited maximally from hospital stay and there were no complications.    Recent vital signs: Patient Vitals for the past 24 hrs:  BP Temp Temp src Pulse Resp SpO2  01/20/13 0607 129/67 mmHg 98.6 F (37 C) Oral 79 18 100 %  01/20/13 0400 - - - - 18 98 %  01/20/13 0000 - - - - 18 97 %  01/19/13 2000 - - - - 18 100 %  01/19/13 1300 122/60 mmHg 98.4 F (36.9 C) - 73 18 100 %      Recent laboratory studies:  Recent Labs  01/19/13 0610 01/20/13 0625  WBC 4.4 7.0  HGB 9.3* 9.3*  HCT 27.6* 27.0*  PLT 285 271  NA 136 134*  K 3.8 3.7  CL 99 95*  CO2 29 31  BUN 7 7  CREATININE 0.83 0.86  GLUCOSE 103* 114*  CALCIUM 9.2 9.6     Discharge Medications:     Medication List    STOP taking these medications       naproxen sodium 220 MG tablet  Commonly known as:  ANAPROX      TAKE these medications       aspirin 325 MG EC tablet  Take 1 tablet (325 mg total) by mouth 2 (two) times daily.     ferrous sulfate 325 (65 FE) MG tablet  Take 1 tablet (325 mg total) by mouth daily with breakfast.     HYDROcodone-acetaminophen 7.5-325 MG per tablet  Commonly known as:  NORCO  Take 1 tablet by mouth at bedtime.     methocarbamol 500 MG tablet  Commonly known as:  ROBAXIN  Take 1 tablet (500 mg total) by mouth 3 (three) times daily as needed (spasms).     triamterene-hydrochlorothiazide 75-50 MG per tablet  Commonly known as:  MAXZIDE  Take 1 tablet by mouth  daily.     zolpidem 10 MG tablet  Commonly known as:  AMBIEN  Take 5 mg by mouth at bedtime as needed. For sleep        Diagnostic Studies: Dg Hip Operative Left  01/18/2013  *RADIOLOGY REPORT*  Clinical Data: Post left anterior approach total hip replacement  OPERATIVE LEFT HIP  Comparison: Pelvic MRI - 07/18/2012; intraoperative radiograph of the right hip - 08/12/2012  Findings:  Two anterior projection intraoperative spot fluoroscopic images of the lower pelvis and left hip are provided for review.  Images demonstrate interval left total hip replacement.  Alignment appears near anatomic on this solitary projection radiograph.  There is a minimal amount of expected subcutaneous emphysema about the operative site.  No definite radiopaque foreign body.  Post right total hip replacement, incompletely evaluated.  IMPRESSION: Post left total hip replacement without definite complicating feature.    Original Report Authenticated By: Tacey Ruiz, MD     Disposition: 06-Home-Health Care Svc      Discharge Orders   Future Orders Complete By Expires     Call MD / Call 911  As directed     Comments:      If you experience chest pain or shortness of breath, CALL 911 and be transported to the hospital emergency room.  If you develope a fever above 101 F, pus (white drainage) or increased drainage or redness at the wound, or calf pain, call your surgeon's office.    Constipation Prevention  As directed     Comments:      Drink plenty of fluids.  Prune juice may be helpful.  You may use a stool softener, such as Colace (over the counter) 100 mg twice a day.  Use MiraLax (over the counter) for constipation as needed.    Diet - low sodium heart healthy  As directed     Increase activity slowly as tolerated  As directed        Follow-up Information   Follow up with DALLDORF,PETER G, MD. Call in 2 weeks.   Contact information:   [address] [place] [address] [phone]        Signed: Prince Rome 01/20/2013, 10:29 AM

## 2013-01-20 NOTE — Progress Notes (Signed)
Physical Therapy Treatment Patient Details Name: Cassandra Case MRN: [id number] DOB: 1950/10/31 Today's Date: 01/20/2013 Time: 3244-0102 PT Time Calculation (min): 23 min  PT Assessment / Plan / Recommendation Comments on Treatment Session  Patient ambulated with more ease.  Patient required min verbal cues to negotiate stairs.      Follow Up Recommendations  Home health PT;Supervision/Assistance - 24 hour     Does the patient have the potential to tolerate intense rehabilitation     Barriers to Discharge        Equipment Recommendations  None recommended by PT    Recommendations for Other Services    Frequency 7X/week   Plan Discharge plan remains appropriate;Frequency remains appropriate    Precautions / Restrictions Precautions Precautions: Fall Precaution Booklet Issued: No Restrictions Weight Bearing Restrictions: Yes LLE Weight Bearing: Weight bearing as tolerated   Pertinent Vitals/Pain 7/10 L Hip    Mobility  Bed Mobility Bed Mobility: Supine to Sit Supine to Sit: 4: Min guard Sitting - Scoot to Edge of Bed: 4: Min guard Details for Bed Mobility Assistance: Patient motivated and moved LLE by herself. Transfers Transfers: Sit to Stand;Stand to Sit Sit to Stand: From bed;With upper extremity assist;5: Supervision Stand to Sit: 4: Min guard;To chair/3-in-1;With upper extremity assist Details for Transfer Assistance: Min guard to control descent. Ambulation/Gait Ambulation/Gait Assistance: 5: Supervision Ambulation Distance (Feet): 75 Feet Assistive device: Rolling walker Ambulation/Gait Assistance Details: Cues to shift weight through LE.   Gait Pattern: Step-to pattern;Decreased stride length Gait velocity: Decreased. Stairs: Yes Stairs Assistance: 5: Curator Details (indicate cue type and reason): Cues for technique. Stair Management Technique: Backwards;With walker Number of Stairs: 3 Wheelchair Mobility Wheelchair Mobility: No     Exercises Total Joint Exercises Quad Sets: AROM;Left;10 reps;Seated Gluteal Sets: AROM;Both;10 reps Hip ABduction/ADduction: AAROM;Left;10 reps Long Arc Quad: AROM;Left;10 reps   PT Diagnosis:    PT Problem List:   PT Treatment Interventions:     PT Goals Acute Rehab PT Goals PT Goal: Supine/Side to Sit - Progress: Progressing toward goal PT Goal: Sit to Stand - Progress: Progressing toward goal PT Goal: Stand to Sit - Progress: Progressing toward goal PT Transfer Goal: Bed to Chair/Chair to Bed - Progress: Progressing toward goal PT Goal: Ambulate - Progress: Progressing toward goal PT Goal: Up/Down Stairs - Progress: Progressing toward goal PT Goal: Perform Home Exercise Program - Progress: Progressing toward goal  Visit Information  Last PT Received On: 01/20/13 Assistance Needed: +1    Subjective Data      Cognition  Cognition Overall Cognitive Status: Appears within functional limits for tasks assessed/performed Arousal/Alertness: Awake/alert Orientation Level: Appears intact for tasks assessed Behavior During Session: [facility] for tasks performed Cognition - Other Comments: Patient complained of lightheadedness.    Balance     End of Session PT - End of Session Equipment Utilized During Treatment: Gait belt Activity Tolerance: Patient tolerated treatment well Patient left: in chair;with call bell/phone within reach   GP     [facility], Cassandra Case  SPTA 01/20/2013, 1:50 PM Seen and agreed  01/20/2013 Fredrich Birks PTA [phone] pager [phone] office

## 2013-01-20 NOTE — Progress Notes (Signed)
Seen and agreed 01/20/2013 Tynell Winchell Elizabeth PTA [phone] pager [phone] office    

## 2013-03-03 ENCOUNTER — Other Ambulatory Visit (HOSPITAL_COMMUNITY): Payer: Self-pay | Admitting: Family Medicine

## 2013-03-03 DIAGNOSIS — Z1231 Encounter for screening mammogram for malignant neoplasm of breast: Secondary | ICD-10-CM

## 2013-03-10 ENCOUNTER — Ambulatory Visit (HOSPITAL_COMMUNITY)
Admission: RE | Admit: 2013-03-10 | Discharge: 2013-03-10 | Disposition: A | Discharge status: Home / Self Care | Payer: BC Managed Care – PPO | Source: Ambulatory Visit | Attending: Family Medicine | Admitting: Family Medicine

## 2013-03-10 DIAGNOSIS — Z1231 Encounter for screening mammogram for malignant neoplasm of breast: Secondary | ICD-10-CM

## 2013-03-11 ENCOUNTER — Other Ambulatory Visit: Payer: Self-pay | Admitting: Family Medicine

## 2013-03-11 DIAGNOSIS — R928 Other abnormal and inconclusive findings on diagnostic imaging of breast: Secondary | ICD-10-CM

## 2013-03-18 ENCOUNTER — Ambulatory Visit
Admission: RE | Admit: 2013-03-18 | Discharge: 2013-03-18 | Disposition: A | Discharge status: Home / Self Care | Payer: BC Managed Care – PPO | Source: Ambulatory Visit | Attending: Family Medicine | Admitting: Family Medicine

## 2013-03-18 DIAGNOSIS — R928 Other abnormal and inconclusive findings on diagnostic imaging of breast: Secondary | ICD-10-CM

## 2014-03-13 ENCOUNTER — Other Ambulatory Visit: Payer: Self-pay

## 2014-03-13 ENCOUNTER — Other Ambulatory Visit (HOSPITAL_COMMUNITY): Payer: Self-pay | Admitting: Family Medicine

## 2014-03-13 DIAGNOSIS — Z1231 Encounter for screening mammogram for malignant neoplasm of breast: Secondary | ICD-10-CM

## 2014-03-21 ENCOUNTER — Ambulatory Visit (HOSPITAL_COMMUNITY): Payer: BC Managed Care – PPO

## 2014-03-28 ENCOUNTER — Ambulatory Visit (HOSPITAL_COMMUNITY)
Admission: RE | Admit: 2014-03-28 | Discharge: 2014-03-28 | Disposition: A | Discharge status: Home / Self Care | Payer: BC Managed Care – PPO | Source: Ambulatory Visit | Attending: Family Medicine | Admitting: Family Medicine

## 2014-03-28 DIAGNOSIS — Z1231 Encounter for screening mammogram for malignant neoplasm of breast: Secondary | ICD-10-CM

## 2014-09-20 ENCOUNTER — Other Ambulatory Visit: Payer: Self-pay | Admitting: Gastroenterology

## 2014-09-28 ENCOUNTER — Other Ambulatory Visit: Payer: Self-pay | Admitting: Gastroenterology

## 2014-10-09 ENCOUNTER — Encounter (HOSPITAL_COMMUNITY): Admission: RE | Payer: Self-pay | Source: Ambulatory Visit

## 2014-10-09 ENCOUNTER — Ambulatory Visit (HOSPITAL_COMMUNITY)
Admission: RE | Admit: 2014-10-09 | Payer: BC Managed Care – PPO | Source: Ambulatory Visit | Admitting: Gastroenterology

## 2014-10-09 SURGERY — COLONOSCOPY WITH PROPOFOL
Anesthesia: Monitor Anesthesia Care

## 2014-11-14 ENCOUNTER — Other Ambulatory Visit: Payer: Self-pay | Admitting: Gastroenterology

## 2014-11-24 ENCOUNTER — Encounter (HOSPITAL_COMMUNITY): Payer: Self-pay | Admitting: *Deleted

## 2014-11-26 NOTE — Anesthesia Preprocedure Evaluation (Addendum)
Anesthesia Evaluation  Patient identified by MRN, date of birth, ID band Patient awake    Reviewed: Allergy & Precautions, H&P , NPO status , Patient's Chart, lab work & pertinent test results  Airway Mallampati: I  TM Distance: >3 FB Neck ROM: full    Dental no notable dental hx. (+) Edentulous Upper, Edentulous Lower, Dental Advisory Given   Pulmonary neg pulmonary ROS,  breath sounds clear to auscultation  Pulmonary exam normal       Cardiovascular Exercise Tolerance: Good hypertension, Pt. on medications Rhythm:regular Rate:Normal     Neuro/Psych negative neurological ROS  negative psych ROS   GI/Hepatic negative GI ROS, Neg liver ROS,   Endo/Other  negative endocrine ROS  Renal/GU negative Renal ROS  negative genitourinary   Musculoskeletal  (+) Arthritis -, Osteoarthritis and Rheumatoid disorders,    Abdominal   Peds  Hematology negative hematology ROS (+)   Anesthesia Other Findings   Reproductive/Obstetrics negative OB ROS                            Anesthesia Physical Anesthesia Plan  ASA: III  Anesthesia Plan: MAC   Post-op Pain Management:    Induction:   Airway Management Planned:   Additional Equipment:   Intra-op Plan:   Post-operative Plan:   Informed Consent: I have reviewed the patients History and Physical, chart, labs and discussed the procedure including the risks, benefits and alternatives for the proposed anesthesia with the patient or authorized representative who has indicated his/her understanding and acceptance.   Dental Advisory Given  Plan Discussed with: CRNA and Surgeon  Anesthesia Plan Comments:         Anesthesia Quick Evaluation

## 2014-11-27 ENCOUNTER — Ambulatory Visit (HOSPITAL_COMMUNITY): Payer: BLUE CROSS/BLUE SHIELD | Admitting: Anesthesiology

## 2014-11-27 ENCOUNTER — Encounter (HOSPITAL_COMMUNITY): Payer: Self-pay | Admitting: Registered Nurse

## 2014-11-27 ENCOUNTER — Encounter (HOSPITAL_COMMUNITY)
Admission: RE | Disposition: A | Discharge status: Home / Self Care | Payer: Self-pay | Source: Ambulatory Visit | Attending: Gastroenterology

## 2014-11-27 ENCOUNTER — Ambulatory Visit (HOSPITAL_COMMUNITY)
Admission: RE | Admit: 2014-11-27 | Discharge: 2014-11-27 | Disposition: A | Discharge status: Home / Self Care | Payer: BLUE CROSS/BLUE SHIELD | Source: Ambulatory Visit | Attending: Gastroenterology | Admitting: Gastroenterology

## 2014-11-27 DIAGNOSIS — M1990 Unspecified osteoarthritis, unspecified site: Secondary | ICD-10-CM | POA: Insufficient documentation

## 2014-11-27 DIAGNOSIS — I10 Essential (primary) hypertension: Secondary | ICD-10-CM | POA: Insufficient documentation

## 2014-11-27 DIAGNOSIS — M8580 Other specified disorders of bone density and structure, unspecified site: Secondary | ICD-10-CM | POA: Diagnosis not present

## 2014-11-27 DIAGNOSIS — M069 Rheumatoid arthritis, unspecified: Secondary | ICD-10-CM | POA: Diagnosis not present

## 2014-11-27 DIAGNOSIS — Z96642 Presence of left artificial hip joint: Secondary | ICD-10-CM | POA: Insufficient documentation

## 2014-11-27 DIAGNOSIS — Z1211 Encounter for screening for malignant neoplasm of colon: Secondary | ICD-10-CM | POA: Diagnosis present

## 2014-11-27 HISTORY — PX: COLONOSCOPY WITH PROPOFOL: SHX5780

## 2014-11-27 SURGERY — COLONOSCOPY WITH PROPOFOL
Anesthesia: Monitor Anesthesia Care

## 2014-11-27 MED ORDER — LACTATED RINGERS IV SOLN: INTRAVENOUS | Status: DC

## 2014-11-27 MED ORDER — SODIUM CHLORIDE 0.9 % IJ SOLN
INTRAMUSCULAR | Status: AC
  Filled 2014-11-27: qty 10

## 2014-11-27 MED ORDER — SODIUM CHLORIDE 0.9 % IV SOLN: INTRAVENOUS | Status: DC

## 2014-11-27 MED ORDER — EPHEDRINE SULFATE 50 MG/ML IJ SOLN
INTRAMUSCULAR | Status: AC
  Filled 2014-11-27: qty 1

## 2014-11-27 MED ORDER — PROPOFOL 10 MG/ML IV BOLUS
INTRAVENOUS | Status: DC | PRN
  Administered 2014-11-27 (×3): 20 mg via INTRAVENOUS

## 2014-11-27 MED ORDER — PROPOFOL 10 MG/ML IV BOLUS
INTRAVENOUS | Status: AC
  Filled 2014-11-27: qty 20

## 2014-11-27 MED ORDER — PROPOFOL INFUSION 10 MG/ML OPTIME
INTRAVENOUS | Status: DC | PRN
  Administered 2014-11-27: 160 ug/kg/min via INTRAVENOUS

## 2014-11-27 MED ORDER — SODIUM CHLORIDE 0.9 % IV SOLN
INTRAVENOUS | Status: DC
Start: 2014-11-27 — End: 2014-11-27

## 2014-11-27 MED ORDER — LIDOCAINE HCL (CARDIAC) 20 MG/ML IV SOLN
INTRAVENOUS | Status: AC
  Filled 2014-11-27: qty 5

## 2014-11-27 MED ORDER — LIDOCAINE HCL (CARDIAC) 20 MG/ML IV SOLN
INTRAVENOUS | Status: DC | PRN
  Administered 2014-11-27: 100 mg via INTRAVENOUS

## 2014-11-27 MED ORDER — FENTANYL CITRATE 0.05 MG/ML IJ SOLN: 25.0000 ug | INTRAMUSCULAR | Status: DC | PRN

## 2014-11-27 MED ORDER — ATROPINE SULFATE 0.4 MG/ML IJ SOLN
INTRAMUSCULAR | Status: AC
  Filled 2014-11-27: qty 2

## 2014-11-27 MED ORDER — LACTATED RINGERS IV SOLN
INTRAVENOUS | Status: DC
  Administered 2014-11-27: 1000 mL via INTRAVENOUS

## 2014-11-27 SURGICAL SUPPLY — 22 items

## 2014-11-27 NOTE — Discharge Instructions (Signed)
Colonoscopy, Care After °These instructions give you information on caring for yourself after your procedure. Your doctor may also give you more specific instructions. Call your doctor if you have any problems or questions after your procedure. °HOME CARE °· Do not drive for 24 hours. °· Do not sign important papers or use machinery for 24 hours. °· You may shower. °· You may go back to your usual activities, but go slower for the first 24 hours. °· Take rest breaks often during the first 24 hours. °· Walk around or use warm packs on your belly (abdomen) if you have belly cramping or gas. °· Drink enough fluids to keep your pee (urine) clear or pale yellow. °· Resume your normal diet. Avoid heavy or fried foods. °· Avoid drinking alcohol for 24 hours or as told by your doctor. °· Only take medicines as told by your doctor. °If a tissue sample (biopsy) was taken during the procedure:  °· Do not take aspirin or blood thinners for 7 days, or as told by your doctor. °· Do not drink alcohol for 7 days, or as told by your doctor. °· Eat soft foods for the first 24 hours. °GET HELP IF: °You still have a small amount of blood in your poop (stool) 2-3 days after the procedure. °GET HELP RIGHT AWAY IF: °· You have more than a small amount of blood in your poop. °· You see clumps of tissue (blood clots) in your poop. °· Your belly is puffy (swollen). °· You feel sick to your stomach (nauseous) or throw up (vomit). °· You have a fever. °· You have belly pain that gets worse and medicine does not help. °MAKE SURE YOU: °· Understand these instructions. °· Will watch your condition. °· Will get help right away if you are not doing well or get worse. °Document Released: 11/01/2010 Document Revised: 10/04/2013 Document Reviewed: 06/06/2013 °ExitCare® Patient Information ©2015 [employer]. This information is not intended to replace advice given to you by your health care provider. Make sure you discuss any questions you have with  your health care provider. ° °

## 2014-11-27 NOTE — H&P (Signed)
  Procedure: Screening colonoscopy. No family history of colon cancer.  History: The patient is a 64 year old female born [birth year]. She is scheduled to undergo a colonoscopy with polypectomy to prevent colon cancer.  Medication allergies: Vicodin causes nausea.  Past medical history: Tonsillectomy. Tubal ligation. T a H-BSO. Lumbar laminectomy. Total right knee replacement surgery. Left hip replacement surgery. Osteopenia. Rheumatoid arthritis. Hypertension.  Exam: The patient is alert and lying comfortably on the endoscopy stretcher. Abdomen is soft and nontender to palpation. Cardiac exam reveals a regular rhythm. Lungs are clear to auscultation.  Plan: Proceed with screening colonoscopy

## 2014-11-27 NOTE — Anesthesia Postprocedure Evaluation (Signed)
  Anesthesia Post-op Note  Patient: Cassandra Case  Procedure(s) Performed: Procedure(s) (LRB): COLONOSCOPY WITH PROPOFOL (N/A)  Patient Location: PACU  Anesthesia Type: MAC  Level of Consciousness: awake and alert   Airway and Oxygen Therapy: Patient Spontanous Breathing  Post-op Pain: mild  Post-op Assessment: Post-op Vital signs reviewed, Patient's Cardiovascular Status Stable, Respiratory Function Stable, Patent Airway and No signs of Nausea or vomiting  Last Vitals:  Filed Vitals:   11/27/14 0835  BP:   Pulse: 58  Temp:   Resp: 14    Post-op Vital Signs: stable   Complications: No apparent anesthesia complications

## 2014-11-27 NOTE — Transfer of Care (Signed)
Immediate Anesthesia Transfer of Care Note  Patient: Cassandra Case  Procedure(s) Performed: Procedure(s): COLONOSCOPY WITH PROPOFOL (N/A)  Patient Location: PACU and Endoscopy Unit  Anesthesia Type:MAC  Level of Consciousness: awake, alert , oriented and patient cooperative  Airway & Oxygen Therapy: Patient Spontanous Breathing and Patient connected to face mask oxygen  Post-op Assessment: Report given to RN, Post -op Vital signs reviewed and stable and Patient moving all extremities  Post vital signs: Reviewed and stable  Last Vitals:  Filed Vitals:   11/27/14 0709  BP: 173/70  Pulse: 56  Temp: 36.4 C  Resp: 16    Complications: No apparent anesthesia complications

## 2014-11-27 NOTE — Anesthesia Procedure Notes (Signed)
Procedure Name: MAC Date/Time: 11/27/2014 7:34 AM Performed by: Jarvis Newcomer A Pre-anesthesia Checklist: Patient identified, Timeout performed, Emergency Drugs available, Suction available and Patient being monitored Patient Re-evaluated:Patient Re-evaluated prior to inductionOxygen Delivery Method: Simple face mask Dental Injury: Teeth and Oropharynx as per pre-operative assessment

## 2014-11-27 NOTE — Op Note (Signed)
Procedure: Screening colonoscopy  Endoscopist: Danise Edge  Premedication: Propofol administered by anesthesia  Procedure: The patient was placed in the left lateral decubitus position. Anal inspection and digital rectal exam were normal. The Pentax pediatric colonoscope was introduced into the rectum and advanced to the cecum. A normal-appearing appendiceal orifice was identified. A normal-appearing ileocecal valve was identified. Colonic preparation for the exam today was good. Withdrawal time was 9 minutes  Rectum. Normal. Retroflexed view of the distal rectum normal  Sigmoid colon and descending colon. Normal  Splenic flexure. Normal  Transverse colon. Normal  Hepatic flexure. Normal  Ascending colon. Normal  Cecum and ileocecal valve. Normal  Assessment: Normal screening colonoscopy  Recommendation: Schedule repeat screening colonoscopy in 10 years

## 2014-11-28 ENCOUNTER — Encounter (HOSPITAL_COMMUNITY): Payer: Self-pay | Admitting: Gastroenterology

## 2015-04-19 ENCOUNTER — Other Ambulatory Visit (HOSPITAL_COMMUNITY): Payer: Self-pay | Admitting: Family Medicine

## 2015-04-19 DIAGNOSIS — Z1231 Encounter for screening mammogram for malignant neoplasm of breast: Secondary | ICD-10-CM

## 2015-04-20 ENCOUNTER — Ambulatory Visit (HOSPITAL_COMMUNITY)
Admission: RE | Admit: 2015-04-20 | Discharge: 2015-04-20 | Disposition: A | Discharge status: Home / Self Care | Payer: BLUE CROSS/BLUE SHIELD | Source: Ambulatory Visit | Attending: Family Medicine | Admitting: Family Medicine

## 2015-04-20 DIAGNOSIS — Z1231 Encounter for screening mammogram for malignant neoplasm of breast: Secondary | ICD-10-CM | POA: Diagnosis present

## 2015-11-15 ENCOUNTER — Ambulatory Visit (INDEPENDENT_AMBULATORY_CARE_PROVIDER_SITE_OTHER): Payer: BLUE CROSS/BLUE SHIELD | Admitting: Family Medicine

## 2015-11-15 VITALS — BP 132/74 | HR 70 | Temp 99.5°F | Resp 18 | Ht 65.0 in | Wt 149.0 lb

## 2015-11-15 DIAGNOSIS — J040 Acute laryngitis: Secondary | ICD-10-CM

## 2015-11-15 DIAGNOSIS — R05 Cough: Secondary | ICD-10-CM | POA: Diagnosis not present

## 2015-11-15 DIAGNOSIS — R059 Cough, unspecified: Secondary | ICD-10-CM

## 2015-11-15 DIAGNOSIS — J069 Acute upper respiratory infection, unspecified: Secondary | ICD-10-CM | POA: Diagnosis not present

## 2015-11-15 MED ORDER — HYDROCODONE-HOMATROPINE 5-1.5 MG/5ML PO SYRP
ORAL_SOLUTION | ORAL | Status: DC
Start: 2015-11-15 — End: 2016-08-12

## 2015-11-15 NOTE — Patient Instructions (Signed)
Voice rest, fluids, Mucinex for cough during the day, hydrocodone cough syrup as needed at night. If you have any side effects with the cough syrup including any hallucinations, stop it right away. Return to the clinic or go to the nearest emergency room if any of your symptoms worsen or new symptoms occur.  Upper Respiratory Infection, Adult Most upper respiratory infections (URIs) are a viral infection of the air passages leading to the lungs. A URI affects the nose, throat, and upper air passages. The most common type of URI is nasopharyngitis and is typically referred to as "the common cold." URIs run their course and usually go away on their own. Most of the time, a URI does not require medical attention, but sometimes a bacterial infection in the upper airways can follow a viral infection. This is called a secondary infection. Sinus and middle ear infections are common types of secondary upper respiratory infections. Bacterial pneumonia can also complicate a URI. A URI can worsen asthma and chronic obstructive pulmonary disease (COPD). Sometimes, these complications can require emergency medical care and may be life threatening.  CAUSES Almost all URIs are caused by viruses. A virus is a type of germ and can spread from one person to another.  RISKS FACTORS You may be at risk for a URI if:   You smoke.   You have chronic heart or lung disease.  You have a weakened defense (immune) system.   You are very young or very old.   You have nasal allergies or asthma.  You work in crowded or poorly ventilated areas.  You work in health care facilities or schools. SIGNS AND SYMPTOMS  Symptoms typically develop 2-3 days after you come in contact with a cold virus. Most viral URIs last 7-10 days. However, viral URIs from the influenza virus (flu virus) can last 14-18 days and are typically more severe. Symptoms may include:   Runny or stuffy (congested) nose.   Sneezing.   Cough.    Sore throat.   Headache.   Fatigue.   Fever.   Loss of appetite.   Pain in your forehead, behind your eyes, and over your cheekbones (sinus pain).  Muscle aches.  DIAGNOSIS  Your health care provider may diagnose a URI by:  Physical exam.  Tests to check that your symptoms are not due to another condition such as:  Strep throat.  Sinusitis.  Pneumonia.  Asthma. TREATMENT  A URI goes away on its own with time. It cannot be cured with medicines, but medicines may be prescribed or recommended to relieve symptoms. Medicines may help:  Reduce your fever.  Reduce your cough.  Relieve nasal congestion. HOME CARE INSTRUCTIONS   Take medicines only as directed by your health care provider.   Gargle warm saltwater or take cough drops to comfort your throat as directed by your health care provider.  Use a warm mist humidifier or inhale steam from a shower to increase air moisture. This may make it easier to breathe.  Drink enough fluid to keep your urine clear or pale yellow.   Eat soups and other clear broths and maintain good nutrition.   Rest as needed.   Return to work when your temperature has returned to normal or as your health care provider advises. You may need to stay home longer to avoid infecting others. You can also use a face mask and careful hand washing to prevent spread of the virus.  Increase the usage of your inhaler if you  have asthma.   Do not use any tobacco products, including cigarettes, chewing tobacco, or electronic cigarettes. If you need help quitting, ask your health care provider. PREVENTION  The best way to protect yourself from getting a cold is to practice good hygiene.   Avoid oral or hand contact with people with cold symptoms.   Wash your hands often if contact occurs.  There is no clear evidence that vitamin C, vitamin E, echinacea, or exercise reduces the chance of developing a cold. However, it is always  recommended to get plenty of rest, exercise, and practice good nutrition.  SEEK MEDICAL CARE IF:   You are getting worse rather than better.   Your symptoms are not controlled by medicine.   You have chills.  You have worsening shortness of breath.  You have brown or red mucus.  You have yellow or brown nasal discharge.  You have pain in your face, especially when you bend forward.  You have a fever.  You have swollen neck glands.  You have pain while swallowing.  You have white areas in the back of your throat. SEEK IMMEDIATE MEDICAL CARE IF:   You have severe or persistent:  Headache.  Ear pain.  Sinus pain.  Chest pain.  You have chronic lung disease and any of the following:  Wheezing.  Prolonged cough.  Coughing up blood.  A change in your usual mucus.  You have a stiff neck.  You have changes in your:  Vision.  Hearing.  Thinking.  Mood. MAKE SURE YOU:   Understand these instructions.  Will watch your condition.  Will get help right away if you are not doing well or get worse.   This information is not intended to replace advice given to you by your health care provider. Make sure you discuss any questions you have with your health care provider.   Document Released: 03/25/2001 Document Revised: 02/13/2015 Document Reviewed: 01/04/2014 Elsevier Interactive Patient Education 2016 [employer]   Laryngitis Laryngitis is inflammation of your vocal cords. This causes hoarseness, coughing, loss of voice, sore throat, or a dry throat. Your vocal cords are two bands of muscles that are found in your throat. When you speak, these cords come together and vibrate. These vibrations come out through your mouth as sound. When your vocal cords are inflamed, your voice sounds different. Laryngitis can be temporary (acute) or long-term (chronic). Most cases of acute laryngitis improve with time. Chronic laryngitis is laryngitis that lasts for more  than three weeks. CAUSES Acute laryngitis may be caused by:  A viral infection.  Lots of talking, yelling, or singing. This is also called vocal strain.  Bacterial infections. Chronic laryngitis may be caused by:  Vocal strain.  Injury to your vocal cords.  Acid reflux (gastroesophageal reflux disease or GERD).  Allergies.  Sinus infection.  Smoking.  Alcohol abuse.  Breathing in chemicals or dust.  Growths on the vocal cords. RISK FACTORS Risk factors for laryngitis include:  Smoking.  Alcohol abuse.  Having allergies. SIGNS AND SYMPTOMS Symptoms of laryngitis may include:  Low, hoarse voice.  Loss of voice.  Dry cough.  Sore throat.  Stuffy nose. DIAGNOSIS Laryngitis may be diagnosed by:  Physical exam.  Throat culture.  Blood test.  Laryngoscopy. This procedure allows your health care provider to look at your vocal cords with a mirror or viewing tube. TREATMENT Treatment for laryngitis depends on what is causing it. Usually, treatment involves resting your voice and using medicines to  soothe your throat. However, if your laryngitis is caused by a bacterial infection, you may need to take antibiotic medicine. If your laryngitis is caused by a growth, you may need to have a procedure to remove it. HOME CARE INSTRUCTIONS  Drink enough fluid to keep your urine clear or pale yellow.  Breathe in moist air. Use a humidifier if you live in a dry climate.  Take medicines only as directed by your health care provider.  If you were prescribed an antibiotic medicine, finish it all even if you start to feel better.  Do not smoke cigarettes or electronic cigarettes. If you need help quitting, ask your health care provider.  Talk as little as possible. Also avoid whispering, which can cause vocal strain.  Write instead of talking. Do this until your voice is back to normal. SEEK MEDICAL CARE IF:  You have a fever.  You have increasing pain.  You  have difficulty swallowing. SEEK IMMEDIATE MEDICAL CARE IF:  You cough up blood.  You have trouble breathing.   This information is not intended to replace advice given to you by your health care provider. Make sure you discuss any questions you have with your health care provider.   Document Released: 09/29/2005 Document Revised: 10/20/2014 Document Reviewed: 03/14/2014 Elsevier Interactive Patient Education [employer]

## 2015-11-15 NOTE — Progress Notes (Addendum)
Subjective:  This chart was scribed for [name], by Veverly Fells, at [facility].  This patient was seen in room 12 and the patient's care was started at 8:35 AM.     Patient ID: Cassandra Case, female    DOB: 02-27-1951, 65 y.o.   MRN: [id number] Chief Complaint  Patient presents with  . Sore Throat    Noticed Monday morning   . Laryngitis    Yesterday afternoon    HPI  HPI Comments: Cassandra Case is a 65 y.o. female who presents to the [facility] complaining of a sore throat onset three days ago. She lost her voice yesterday and has associated symptoms of nasal congestion/pressure as well as a productive cough.  Patient has been drinking plenty of tea with honey and has taken Mucin-ex but denies complete relief. She was not able to sleep last night due to her cough and states that she was "up all night".  She denies any history of asthma.  She did not receive her flu shot this year. Patient's husband has been at home sick and states that many of her co-workers (at [employer]) have been sick as well.  Denies fever/chills.   Patients has had hallucinations in the hospital when she was given Vicodin but states that hydrocodone cough syrup has worked well for her in the past.     Patient Active Problem List   Diagnosis Date Noted  . OA (osteoarthritis) of hip 01/18/2013    Class: Chronic  . Hypokalemia 08/14/2012    Class: Acute  . DJD (degenerative joint disease) of hip 08/12/2012    Class: Chronic   Past Medical History  Diagnosis Date  . Hypertension     takes Maxzide daily  . Arthritis   . Joint pain   . Joint swelling   . Rheumatoid arthritis(714.0)   . Chronic back pain     hx of buldging disc   Past Surgical History  Procedure Laterality Date  . Hernia repair      at age 54-umbilical  . Tonsillectomy      at age 37  . Abdominal hysterectomy    . Back surgery  1987/2013  . Colonoscopy    . Total hip  arthroplasty  08/12/2012    Procedure: TOTAL HIP ARTHROPLASTY ANTERIOR APPROACH;  Surgeon: Velna Ochs, MD;  Location: [facility] OR;  Service: Orthopedics;  Laterality: Right;  . Total hip arthroplasty Left 01/18/2013    Procedure: TOTAL HIP ARTHROPLASTY ANTERIOR APPROACH;  Surgeon: Velna Ochs, MD;  Location: [facility] OR;  Service: Orthopedics;  Laterality: Left;  . Colonoscopy with propofol N/A 11/27/2014    Procedure: COLONOSCOPY WITH PROPOFOL;  Surgeon: Charolett Bumpers, MD;  Location: [facility] ENDOSCOPY;  Service: Endoscopy;  Laterality: N/A;  . Joint replacement     Allergies  Allergen Reactions  . Vicodin [Hydrocodone-Acetaminophen] Other (See Comments)    hallucination   Prior to Admission medications   Medication Sig Start Date End Date Taking? Authorizing Provider  cholecalciferol (VITAMIN D) 1000 UNITS tablet Take 1,000 Units by mouth daily. Reported on 11/15/2015   Yes Historical Provider, MD  Omega-3 Fatty Acids (OMEGA 3 PO) Take 1 capsule by mouth daily. Reported on 11/15/2015   Yes Historical Provider, MD  potassium chloride SA (K-DUR,KLOR-CON) 20 MEQ tablet Take 20 mEq by mouth daily. Reported on 11/15/2015   Yes Historical Provider, MD  triamterene-hydrochlorothiazide (MAXZIDE) 75-50 MG per tablet Take 1 tablet  by mouth daily. Reported on 11/15/2015   Yes Historical Provider, MD  aspirin EC 325 MG EC tablet Take 1 tablet (325 mg total) by mouth 2 (two) times daily. Patient not taking: Reported on 11/14/2014 01/20/13   Lindwood Qua, PA-C  ferrous sulfate 325 (65 FE) MG tablet Take 1 tablet (325 mg total) by mouth daily with breakfast. Patient not taking: Reported on 11/14/2014 01/20/13   Lindwood Qua, PA-C  methocarbamol (ROBAXIN) 500 MG tablet Take 1 tablet (500 mg total) by mouth 3 (three) times daily as needed (spasms). Patient not taking: Reported on 11/14/2014 01/20/13   Lindwood Qua, PA-C   Social History   Social History  . Marital Status: Married    Spouse Name: N/A  . Number of  Children: N/A  . Years of Education: N/A   Occupational History  . Not on file.   Social History Main Topics  . Smoking status: Never Smoker   . Smokeless tobacco: Not on file  . Alcohol Use: No  . Drug Use: No  . Sexual Activity: Yes    Birth Control/ Protection: Surgical   Other Topics Concern  . Not on file   Social History Narrative       Review of Systems  Constitutional: Negative for fever and chills.  HENT: Positive for congestion, sinus pressure, sore throat and voice change.   Eyes: Negative for pain, redness and itching.  Respiratory: Positive for cough. Negative for choking.   Gastrointestinal: Negative for nausea and vomiting.  Musculoskeletal: Negative for neck pain and neck stiffness.  Skin: Negative for color change.  Neurological: Negative for seizures and syncope.       Objective:   Physical Exam  Constitutional: She is oriented to person, Case, and time. She appears well-developed and well-nourished. No distress.  HENT:  Head: Normocephalic and atraumatic.  Right Ear: Hearing, tympanic membrane, external ear and ear canal normal.  Left Ear: Hearing, tympanic membrane, external ear and ear canal normal.  Nose: Nose normal.  Mouth/Throat: Oropharynx is clear and moist. No oropharyngeal exudate.  Nose- Minimal clear discharge  No sinus tenderness  Eyes: Conjunctivae and EOM are normal. Pupils are equal, round, and reactive to light.  Neck: Normal range of motion.  No lymphadenopathy  Cardiovascular: Normal rate, regular rhythm, normal heart sounds and intact distal pulses.   No murmur heard. Pulmonary/Chest: Effort normal and breath sounds normal. No respiratory distress. She has no wheezes. She has no rhonchi.  Lymphadenopathy:    She has no cervical adenopathy.  Neurological: She is alert and oriented to person, Case, and time.  Skin: Skin is warm and dry. No rash noted.  Psychiatric: She has a normal mood and affect. Her behavior is normal.    Vitals reviewed.   Filed Vitals:   11/15/15 0831  BP: 132/74  Pulse: 70  Temp: 99.5 F (37.5 C)  TempSrc: Oral  Resp: 18  Height: 5\' 5"  (1.651 m)  Weight: 149 lb (67.586 kg)  SpO2: 96%      Assessment & Plan:   Cassandra Case is a 65 y.o. female Acute upper respiratory infection  Cough - Plan: HYDROcodone-homatropine (HYCODAN) 5-1.5 MG/5ML syrup  Laryngitis  Viral upper respiratory infection likely, with secondary laryngitis. Afebrile, reassuring exam. Symptomatic care discussed, but RTC precautions were also discussed with fevers, or progression of symptoms.  - Hycodan cough syrup given for nighttime as she has taken this before without difficulty, but with listed allergy of hydrocodone causing hallucinations, cautioned on any  hallucinations or side effects, and if these occur - should stop this medication. Understanding expressed.  Meds ordered this encounter  Medications  . HYDROcodone-homatropine (HYCODAN) 5-1.5 MG/5ML syrup    Sig: 62m by mouth a bedtime as needed for cough.    Dispense:  120 mL    Refill:  0   Patient Instructions  Voice rest, fluids, Mucinex for cough during the day, hydrocodone cough syrup as needed at night. If you have any side effects with the cough syrup including any hallucinations, stop it right away. Return to the clinic or go to the nearest emergency room if any of your symptoms worsen or new symptoms occur.  Upper Respiratory Infection, Adult Most upper respiratory infections (URIs) are a viral infection of the air passages leading to the lungs. A URI affects the nose, throat, and upper air passages. The most common type of URI is nasopharyngitis and is typically referred to as "the common cold." URIs run their course and usually go away on their own. Most of the time, a URI does not require medical attention, but sometimes a bacterial infection in the upper airways can follow a viral infection. This is called a secondary infection. Sinus  and middle ear infections are common types of secondary upper respiratory infections. Bacterial pneumonia can also complicate a URI. A URI can worsen asthma and chronic obstructive pulmonary disease (COPD). Sometimes, these complications can require emergency medical care and may be life threatening.  CAUSES Almost all URIs are caused by viruses. A virus is a type of germ and can spread from one person to another.  RISKS FACTORS You may be at risk for a URI if:   You smoke.   You have chronic heart or lung disease.  You have a weakened defense (immune) system.   You are very young or very old.   You have nasal allergies or asthma.  You work in crowded or poorly ventilated areas.  You work in health care facilities or schools. SIGNS AND SYMPTOMS  Symptoms typically develop 2-3 days after you come in contact with a cold virus. Most viral URIs last 7-10 days. However, viral URIs from the influenza virus (flu virus) can last 14-18 days and are typically more severe. Symptoms may include:   Runny or stuffy (congested) nose.   Sneezing.   Cough.   Sore throat.   Headache.   Fatigue.   Fever.   Loss of appetite.   Pain in your forehead, behind your eyes, and over your cheekbones (sinus pain).  Muscle aches.  DIAGNOSIS  Your health care provider may diagnose a URI by:  Physical exam.  Tests to check that your symptoms are not due to another condition such as:  Strep throat.  Sinusitis.  Pneumonia.  Asthma. TREATMENT  A URI goes away on its own with time. It cannot be cured with medicines, but medicines may be prescribed or recommended to relieve symptoms. Medicines may help:  Reduce your fever.  Reduce your cough.  Relieve nasal congestion. HOME CARE INSTRUCTIONS   Take medicines only as directed by your health care provider.   Gargle warm saltwater or take cough drops to comfort your throat as directed by your health care provider.  Use a  warm mist humidifier or inhale steam from a shower to increase air moisture. This may make it easier to breathe.  Drink enough fluid to keep your urine clear or pale yellow.   Eat soups and other clear broths and maintain good nutrition.  Rest as needed.   Return to work when your temperature has returned to normal or as your health care provider advises. You may need to stay home longer to avoid infecting others. You can also use a face mask and careful hand washing to prevent spread of the virus.  Increase the usage of your inhaler if you have asthma.   Do not use any tobacco products, including cigarettes, chewing tobacco, or electronic cigarettes. If you need help quitting, ask your health care provider. PREVENTION  The best way to protect yourself from getting a cold is to practice good hygiene.   Avoid oral or hand contact with people with cold symptoms.   Wash your hands often if contact occurs.  There is no clear evidence that vitamin C, vitamin E, echinacea, or exercise reduces the chance of developing a cold. However, it is always recommended to get plenty of rest, exercise, and practice good nutrition.  SEEK MEDICAL CARE IF:   You are getting worse rather than better.   Your symptoms are not controlled by medicine.   You have chills.  You have worsening shortness of breath.  You have brown or red mucus.  You have yellow or brown nasal discharge.  You have pain in your face, especially when you bend forward.  You have a fever.  You have swollen neck glands.  You have pain while swallowing.  You have white areas in the back of your throat. SEEK IMMEDIATE MEDICAL CARE IF:   You have severe or persistent:  Headache.  Ear pain.  Sinus pain.  Chest pain.  You have chronic lung disease and any of the following:  Wheezing.  Prolonged cough.  Coughing up blood.  A change in your usual mucus.  You have a stiff neck.  You have changes in  your:  Vision.  Hearing.  Thinking.  Mood. MAKE SURE YOU:   Understand these instructions.  Will watch your condition.  Will get help right away if you are not doing well or get worse.   This information is not intended to replace advice given to you by your health care provider. Make sure you discuss any questions you have with your health care provider.   Document Released: 03/25/2001 Document Revised: 02/13/2015 Document Reviewed: 01/04/2014 Elsevier Interactive Patient Education 2016 Else[employer] Inc.   Laryngitis Laryngitis is inflammation of your vocal cords. This causes hoarseness, coughing, loss of voice, sore throat, or a dry throat. Your vocal cords are two bands of muscles that are found in your throat. When you speak, these cords come together and vibrate. These vibrations come out through your mouth as sound. When your vocal cords are inflamed, your voice sounds different. Laryngitis can be temporary (acute) or long-term (chronic). Most cases of acute laryngitis improve with time. Chronic laryngitis is laryngitis that lasts for more than three weeks. CAUSES Acute laryngitis may be caused by:  A viral infection.  Lots of talking, yelling, or singing. This is also called vocal strain.  Bacterial infections. Chronic laryngitis may be caused by:  Vocal strain.  Injury to your vocal cords.  Acid reflux (gastroesophageal reflux disease or GERD).  Allergies.  Sinus infection.  Smoking.  Alcohol abuse.  Breathing in chemicals or dust.  Growths on the vocal cords. RISK FACTORS Risk factors for laryngitis include:  Smoking.  Alcohol abuse.  Having allergies. SIGNS AND SYMPTOMS Symptoms of laryngitis may include:  Low, hoarse voice.  Loss of voice.  Dry cough.  Sore throat.  Stuffy nose. DIAGNOSIS Laryngitis may be diagnosed by:  Physical exam.  Throat culture.  Blood test.  Laryngoscopy. This procedure allows your health care provider  to look at your vocal cords with a mirror or viewing tube. TREATMENT Treatment for laryngitis depends on what is causing it. Usually, treatment involves resting your voice and using medicines to soothe your throat. However, if your laryngitis is caused by a bacterial infection, you may need to take antibiotic medicine. If your laryngitis is caused by a growth, you may need to have a procedure to remove it. HOME CARE INSTRUCTIONS  Drink enough fluid to keep your urine clear or pale yellow.  Breathe in moist air. Use a humidifier if you live in a dry climate.  Take medicines only as directed by your health care provider.  If you were prescribed an antibiotic medicine, finish it all even if you start to feel better.  Do not smoke cigarettes or electronic cigarettes. If you need help quitting, ask your health care provider.  Talk as little as possible. Also avoid whispering, which can cause vocal strain.  Write instead of talking. Do this until your voice is back to normal. SEEK MEDICAL CARE IF:  You have a fever.  You have increasing pain.  You have difficulty swallowing. SEEK IMMEDIATE MEDICAL CARE IF:  You cough up blood.  You have trouble breathing.   This information is not intended to replace advice given to you by your health care provider. Make sure you discuss any questions you have with your health care provider.   Document Released: 09/29/2005 Document Revised: 10/20/2014 Document Reviewed: 03/14/2014 Elsevier Interactive Patient Education [employer]     I personally performed the services described in this documentation, which was scribed in my presence. The recorded information has been reviewed and considered, and addended by me as needed.

## 2016-05-13 ENCOUNTER — Other Ambulatory Visit: Payer: Self-pay | Admitting: Family Medicine

## 2016-05-13 DIAGNOSIS — Z1231 Encounter for screening mammogram for malignant neoplasm of breast: Secondary | ICD-10-CM

## 2016-05-23 ENCOUNTER — Ambulatory Visit: Payer: BLUE CROSS/BLUE SHIELD

## 2016-06-03 ENCOUNTER — Ambulatory Visit
Admission: RE | Admit: 2016-06-03 | Discharge: 2016-06-03 | Disposition: A | Discharge status: Home / Self Care | Payer: BLUE CROSS/BLUE SHIELD | Source: Ambulatory Visit | Attending: Family Medicine | Admitting: Family Medicine

## 2016-06-03 DIAGNOSIS — Z1231 Encounter for screening mammogram for malignant neoplasm of breast: Secondary | ICD-10-CM

## 2016-06-04 ENCOUNTER — Ambulatory Visit: Payer: BLUE CROSS/BLUE SHIELD

## 2016-08-12 ENCOUNTER — Emergency Department (HOSPITAL_COMMUNITY)
Admission: EM | Admit: 2016-08-12 | Discharge: 2016-08-13 | Disposition: A | Discharge status: Other Acute Inpatient Hospital | Payer: BLUE CROSS/BLUE SHIELD | Attending: Emergency Medicine | Admitting: Emergency Medicine

## 2016-08-12 ENCOUNTER — Encounter (HOSPITAL_COMMUNITY): Payer: Self-pay | Admitting: *Deleted

## 2016-08-12 ENCOUNTER — Ambulatory Visit (INDEPENDENT_AMBULATORY_CARE_PROVIDER_SITE_OTHER): Payer: BLUE CROSS/BLUE SHIELD | Admitting: Physician Assistant

## 2016-08-12 VITALS — BP 136/76 | HR 80 | Temp 99.7°F | Resp 17 | Ht 65.0 in | Wt 143.0 lb

## 2016-08-12 DIAGNOSIS — I10 Essential (primary) hypertension: Secondary | ICD-10-CM | POA: Insufficient documentation

## 2016-08-12 DIAGNOSIS — K130 Diseases of lips: Secondary | ICD-10-CM

## 2016-08-12 DIAGNOSIS — R791 Abnormal coagulation profile: Secondary | ICD-10-CM | POA: Diagnosis not present

## 2016-08-12 DIAGNOSIS — R220 Localized swelling, mass and lump, head: Secondary | ICD-10-CM | POA: Diagnosis not present

## 2016-08-12 DIAGNOSIS — Z96643 Presence of artificial hip joint, bilateral: Secondary | ICD-10-CM | POA: Insufficient documentation

## 2016-08-12 DIAGNOSIS — L511 Stevens-Johnson syndrome: Secondary | ICD-10-CM | POA: Diagnosis present

## 2016-08-12 DIAGNOSIS — K1379 Other lesions of oral mucosa: Secondary | ICD-10-CM | POA: Diagnosis present

## 2016-08-12 DIAGNOSIS — D696 Thrombocytopenia, unspecified: Secondary | ICD-10-CM | POA: Diagnosis not present

## 2016-08-12 LAB — COMPREHENSIVE METABOLIC PANEL
ALK PHOS: 83 U/L (ref 38–126)
ALT: 32 U/L (ref 14–54)
ANION GAP: 9 (ref 5–15)
AST: 35 U/L (ref 15–41)
Albumin: 4.3 g/dL (ref 3.5–5.0)
BILIRUBIN TOTAL: 1.2 mg/dL (ref 0.3–1.2)
BUN: 10 mg/dL (ref 6–20)
CO2: 27 mmol/L (ref 22–32)
Calcium: 9.6 mg/dL (ref 8.9–10.3)
Chloride: 102 mmol/L (ref 101–111)
Creatinine, Ser: 1.16 mg/dL — ABNORMAL HIGH (ref 0.44–1.00)
GFR calc Af Amer: 57 mL/min — ABNORMAL LOW (ref >60)
GFR, EST NON AFRICAN AMERICAN: 49 mL/min — AB (ref >60)
Glucose, Bld: 87 mg/dL (ref 65–99)
Potassium: 3.4 mmol/L — ABNORMAL LOW (ref 3.5–5.1)
Sodium: 138 mmol/L (ref 135–145)
TOTAL PROTEIN: 7.7 g/dL (ref 6.5–8.1)

## 2016-08-12 LAB — I-STAT CHEM 8, ED
BUN: 13 mg/dL (ref 6–20)
Calcium, Ion: 1.08 mmol/L — ABNORMAL LOW (ref 1.15–1.40)
Chloride: 100 mmol/L — ABNORMAL LOW (ref 101–111)
Creatinine, Ser: 1.2 mg/dL — ABNORMAL HIGH (ref 0.44–1.00)
Glucose, Bld: 84 mg/dL (ref 65–99)
HCT: 35 % — ABNORMAL LOW (ref 36.0–46.0)
HEMOGLOBIN: 11.9 g/dL — AB (ref 12.0–15.0)
POTASSIUM: 3.5 mmol/L (ref 3.5–5.1)
SODIUM: 139 mmol/L (ref 135–145)
TCO2: 27 mmol/L (ref 0–100)

## 2016-08-12 LAB — COMPLETE METABOLIC PANEL WITH GFR
ALBUMIN: 4.7 g/dL (ref 3.6–5.1)
ALK PHOS: 81 U/L (ref 33–130)
ALT: 25 U/L (ref 6–29)
AST: 30 U/L (ref 10–35)
BILIRUBIN TOTAL: 0.9 mg/dL (ref 0.2–1.2)
BUN: 14 mg/dL (ref 7–25)
CO2: 26 mmol/L (ref 20–31)
Calcium: 9.3 mg/dL (ref 8.6–10.4)
Chloride: 98 mmol/L (ref 98–110)
Creat: 1.2 mg/dL — ABNORMAL HIGH (ref 0.50–0.99)
GFR, Est African American: 56 mL/min — ABNORMAL LOW (ref >=60)
GFR, Est Non African American: 48 mL/min — ABNORMAL LOW (ref >=60)
Glucose, Bld: 83 mg/dL (ref 65–99)
Potassium: 3.7 mmol/L (ref 3.5–5.3)
SODIUM: 136 mmol/L (ref 135–146)
TOTAL PROTEIN: 7.6 g/dL (ref 6.1–8.1)

## 2016-08-12 LAB — CBC
HEMATOCRIT: 33.5 % — AB (ref 36.0–46.0)
Hemoglobin: 11.4 g/dL — ABNORMAL LOW (ref 12.0–15.0)
MCH: 29.5 pg (ref 26.0–34.0)
MCHC: 34 g/dL (ref 30.0–36.0)
MCV: 86.8 fL (ref 78.0–100.0)
PLATELETS: 27 10*3/uL — AB (ref 150–400)
RBC: 3.86 MIL/uL — ABNORMAL LOW (ref 3.87–5.11)
RDW: 12.7 % (ref 11.5–15.5)
WBC: 2.5 10*3/uL — AB (ref 4.0–10.5)

## 2016-08-12 LAB — POCT CBC
Granulocyte percent: 55.5 %G (ref 37–80)
HEMATOCRIT: 32.6 % — AB (ref 37.7–47.9)
HEMOGLOBIN: 11.4 g/dL — AB (ref 12.2–16.2)
Lymph, poc: 1.2 (ref 0.6–3.4)
MCH, POC: 30.5 pg (ref 27–31.2)
MCHC: 34.9 g/dL (ref 31.8–35.4)
MCV: 87.4 fL (ref 80–97)
MID (cbc): 0.1 (ref 0–0.9)
MPV: 8.3 fL (ref 0–99.8)
POC Granulocyte: 1.6 — AB (ref 2–6.9)
POC LYMPH %: 42.4 % (ref 10–50)
POC MID %: 2.1 %M (ref 0–12)
Platelet Count, POC: 33 10*3/uL — AB (ref 142–424)
RBC: 3.73 M/uL — AB (ref 4.04–5.48)
RDW, POC: 13 %
WBC: 2.9 10*3/uL — AB (ref 4.6–10.2)

## 2016-08-12 LAB — POCT SKIN KOH: Skin KOH, POC: NEGATIVE

## 2016-08-12 MED ORDER — SODIUM CHLORIDE 0.9 % IV BOLUS (SEPSIS)
500.0000 mL | Freq: Once | INTRAVENOUS | Status: AC
  Administered 2016-08-12: 500 mL via INTRAVENOUS

## 2016-08-12 MED ORDER — SODIUM CHLORIDE 0.9 % IV SOLN
INTRAVENOUS | Status: DC
  Administered 2016-08-12: 23:00:00 via INTRAVENOUS

## 2016-08-12 NOTE — ED Triage Notes (Signed)
The pt has a lip,lesion on her lower lip for 3-4  More swollen today no hx

## 2016-08-12 NOTE — Progress Notes (Signed)
08/12/2016 5:38 PM   DOB: [birth year] / MRN: [id number]  SUBJECTIVE:  Cassandra Case is a 65 y.o. female presenting for bottom lip swelling that started 7 days ago.  Reports it is suddenly worse today.  Is seen my rheum for RA and recently started Meloxicam and methotrexate.  She reports pain throughout her mouth.  Denies any sick contacts.   She is allergic to vicodin [hydrocodone-acetaminophen].   She  has a past medical history of Arthritis; Chronic back pain; Hypertension; Joint pain; Joint swelling; and Rheumatoid arthritis(714.0).    She  reports that she has never smoked. She does not have any smokeless tobacco history on file. She reports that she does not drink alcohol or use drugs. She  reports that she currently engages in sexual activity. She reports using the following method of birth control/protection: Surgical. The patient  has a past surgical history that includes Hernia repair; Tonsillectomy; Abdominal hysterectomy; Back surgery (1987/2013); Colonoscopy; Total hip arthroplasty (08/12/2012); Total hip arthroplasty (Left, 01/18/2013); Colonoscopy with propofol (N/A, 11/27/2014); and Joint replacement.  Her family history is not on file.  Review of Systems  Constitutional: Negative for fever.  Skin: Negative for itching and rash.  Neurological: Negative for dizziness and headaches.    The problem list and medications were reviewed and updated by myself where necessary and exist elsewhere in the encounter.   OBJECTIVE:  BP 136/76 (BP Location: Left Arm, Patient Position: Sitting, Cuff Size: Normal)   Pulse 80   Temp 99.7 F (37.6 C) (Oral)   Resp 17   Ht 5\' 5"  (1.651 m)   Wt 143 lb (64.9 kg)   SpO2 99%   BMI 23.80 kg/m   Lab Results  Component Value Date   ALT 20 10/03/2008   AST 22 10/03/2008   ALKPHOS 78 10/03/2008   BILITOT 0.5 10/03/2008   Physical Exam  Constitutional: She is oriented to person, place, and time.  HENT:  Confluent bullae about the upper  and lower lips, covered with a grey-white membrane.  Some bilateral vesicles of the upper palate.   Cardiovascular: Normal rate and regular rhythm.   Pulmonary/Chest: Effort normal and breath sounds normal.  Neurological: She is alert and oriented to person, place, and time.    Results for orders placed or performed in visit on 08/12/16 (from the past 72 hour(s))  POCT CBC     Status: Abnormal   Collection Time: 08/12/16  5:22 PM  Result Value Ref Range   WBC 2.9 (A) 4.6 - 10.2 K/uL   Lymph, poc 1.2 0.6 - 3.4   POC LYMPH PERCENT 42.4 10 - 50 %L   MID (cbc) 0.1 0 - 0.9   POC MID % 2.1 0 - 12 %M   POC Granulocyte 1.6 (A) 2 - 6.9   Granulocyte percent 55.5 37 - 80 %G   RBC 3.73 (A) 4.04 - 5.48 M/uL   Hemoglobin 11.4 (A) 12.2 - 16.2 g/dL   HCT, POC 08/14/16 (A) 29.5 - 47.9 %   MCV 87.4 80 - 97 fL   MCH, POC 30.5 27 - 31.2 pg   MCHC 34.9 31.8 - 35.4 g/dL   RDW, POC 18.8 %   Platelet Count, POC 33 (A) 142 - 424 K/uL   MPV 8.3 0 - 99.8 fL  POCT Skin KOH     Status: None   Collection Time: 08/12/16  5:23 PM  Result Value Ref Range   Skin KOH, POC Negative  No results found.  ASSESSMENT AND PLAN  Kaleb was seen today for facial swelling.  Diagnoses and all orders for this visit:  Lip pain: Case discussed with Dr. 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  Leukopenia and neutropenia also pointing in the direction of SJS.  Advised she go directly to the ED for evaluation.    -     POCT CBC -     COMPLETE METABOLIC PANEL WITH GFR  Lip swelling -     POCT Skin KOH    The patient is advised to call or return to clinic if she does not see an improvement in symptoms, or to seek the care of the closest emergency department if she worsens with the above plan.   Deliah Boston, MHS, PA-C [facility] 08/12/2016 5:38 PM

## 2016-08-12 NOTE — Patient Instructions (Addendum)
Go directly to [facility] ED for evaluation of SJS.     IF you received an x-ray today, you will receive an invoice from [facility]. Please contact [facility] at [phone] with questions or concerns regarding your invoice.   IF you received labwork today, you will receive an invoice from [employer]. Please contact [facility] at [phone] with questions or concerns regarding your invoice.   Our billing staff will not be able to assist you with questions regarding bills from these companies.  You will be contacted with the lab results as soon as they are available. The fastest way to get your results is to activate your My Chart account. Instructions are located on the last page of this paperwork. If you have not heard from [place] regarding the results in 2 weeks, please contact this office.

## 2016-08-12 NOTE — ED Provider Notes (Signed)
[facility] Provider Note   CSN: [id number] Arrival date & time: 08/12/16  1846     History   Chief Complaint Chief Complaint  Patient presents with  . Mouth Lesions    HPI Cassandra Case is a 65 y.o. female.  HPI [facility] which started 48 hours ago.  Gotten much worse.  Patient has history of rheumatoid arthritis and is on methotrexate and mobile.  Denies any other skin lesions.  Denies nausea or vomiting.  Has had difficulty swallowing. Past Medical History:  Diagnosis Date  . Arthritis   . Chronic back pain    hx of buldging disc  . Hypertension    takes Maxzide daily  . Joint pain   . Joint swelling   . Rheumatoid arthritis(714.0)     Patient Active Problem List   Diagnosis Date Noted  . Stevens-Johnson syndrome (HCC) 08/13/2016  . Thrombocytopenia (HCC) 08/13/2016  . OA (osteoarthritis) of hip 01/18/2013    Class: Chronic  . Hypokalemia 08/14/2012    Class: Acute  . DJD (degenerative joint disease) of hip 08/12/2012    Class: Chronic    Past Surgical History:  Procedure Laterality Date  . ABDOMINAL HYSTERECTOMY    . BACK SURGERY  1987/2013  . COLONOSCOPY    . COLONOSCOPY WITH PROPOFOL N/A 11/27/2014   Procedure: COLONOSCOPY WITH PROPOFOL;  Surgeon: Charolett Bumpers, MD;  Location: [facility];  Service: Endoscopy;  Laterality: N/A;  . HERNIA REPAIR     at age 33-umbilical  . JOINT REPLACEMENT    . TONSILLECTOMY     at age 14  . TOTAL HIP ARTHROPLASTY  08/12/2012   Procedure: TOTAL HIP ARTHROPLASTY ANTERIOR APPROACH;  Surgeon: Velna Ochs, MD;  Location: [facility] OR;  Service: Orthopedics;  Laterality: Right;  . TOTAL HIP ARTHROPLASTY Left 01/18/2013   Procedure: TOTAL HIP ARTHROPLASTY ANTERIOR APPROACH;  Surgeon: Velna Ochs, MD;  Location: [facility] OR;  Service: Orthopedics;  Laterality: Left;    OB History    No data available       Home Medications    Prior to Admission  medications   Medication Sig Start Date End Date Taking? Authorizing Provider  folic acid (FOLVITE) 1 MG tablet Take 1 mg by mouth daily.   Yes Historical Provider, MD  meloxicam (MOBIC) 15 MG tablet Take 15 mg by mouth daily.   Yes Historical Provider, MD  methotrexate (RHEUMATREX) 2.5 MG tablet Take 2.5 mg by mouth daily. Caution:Chemotherapy. Protect from light.    Yes Historical Provider, MD  potassium chloride SA (K-DUR,KLOR-CON) 20 MEQ tablet Take 20 mEq by mouth daily. Reported on 11/15/2015   Yes Historical Provider, MD  triamterene-hydrochlorothiazide (MAXZIDE) 75-50 MG per tablet Take 1 tablet by mouth daily. Reported on 11/15/2015   Yes Historical Provider, MD    Family History No family history on file.  Social History Social History  Substance Use Topics  . Smoking status: Never Smoker  . Smokeless tobacco: Never Used  . Alcohol use No     Allergies   Vicodin [hydrocodone-acetaminophen]   Review of Systems Review of Systems  All other systems reviewed and are negative.    Physical Exam Updated Vital Signs BP 148/70   Pulse (!) 56   Temp 98.6 F (37 C) (Oral)   Resp 16   Ht 5\' 5"  (1.651 m)   Wt 143 lb 8 oz (65.1 kg)   SpO2 100%   BMI  23.88 kg/m   Physical Exam  Constitutional: She is oriented to person, place, and time. She appears well-developed and well-nourished. No distress.  HENT:  Head: Normocephalic and atraumatic.  Mouth/Throat: Uvula is midline. Oral lesions present.    Oral lesions consistent with possible Stevens-Johnson syndrome  Eyes: Pupils are equal, round, and reactive to light.  Neck: Normal range of motion.  Cardiovascular: Normal rate and intact distal pulses.   Pulmonary/Chest: No respiratory distress.  Abdominal: Normal appearance. She exhibits no distension.  Musculoskeletal: Normal range of motion.  Neurological: She is alert and oriented to person, place, and time. No cranial nerve deficit.  Skin: Skin is warm and dry. No rash  noted.  Psychiatric: She has a normal mood and affect. Her behavior is normal.  Nursing note and vitals reviewed.    ED Treatments / Results  Labs (all labs ordered are listed, but only abnormal results are displayed) Labs Reviewed  COMPREHENSIVE METABOLIC PANEL - Abnormal; Notable for the following:       Result Value   Potassium 3.4 (*)    Creatinine, Ser 1.16 (*)    GFR calc non Af Amer 49 (*)    GFR calc Af Amer 57 (*)    All other components within normal limits  CBC - Abnormal; Notable for the following:    WBC 2.5 (*)    RBC 3.86 (*)    Hemoglobin 11.4 (*)    HCT 33.5 (*)    Platelets 27 (*)    All other components within normal limits  I-STAT CHEM 8, ED - Abnormal; Notable for the following:    Chloride 100 (*)    Creatinine, Ser 1.20 (*)    Calcium, Ion 1.08 (*)    Hemoglobin 11.9 (*)    HCT 35.0 (*)    All other components within normal limits  PROTIME-INR    EKG  EKG Interpretation None       Radiology No results found.  Procedures Procedures (including critical care time)  Medications Ordered in ED Medications  sodium chloride 0.9 % bolus 500 mL (0 mLs Intravenous Stopped 08/13/16 0121)     Initial Impression / Assessment and Plan / ED Course  I have reviewed the triage vital signs and the nursing notes.  Pertinent labs & imaging results that were available during my care of the patient were reviewed by me and considered in my medical decision making (see chart for details).  Clinical Course   Internal medicine was consult at for possible admission.   Final Clinical Impressions(s) / ED Diagnoses   Final diagnoses:  Stevens-Johnson syndrome [facility])    New Prescriptions Discharge Medication List as of 08/13/2016  2:28 AM       Nelva Nay, MD 09/10/16 [phone]

## 2016-08-13 DIAGNOSIS — Z96643 Presence of artificial hip joint, bilateral: Secondary | ICD-10-CM | POA: Diagnosis not present

## 2016-08-13 DIAGNOSIS — D696 Thrombocytopenia, unspecified: Secondary | ICD-10-CM

## 2016-08-13 DIAGNOSIS — R791 Abnormal coagulation profile: Secondary | ICD-10-CM | POA: Diagnosis not present

## 2016-08-13 DIAGNOSIS — K1379 Other lesions of oral mucosa: Secondary | ICD-10-CM | POA: Diagnosis present

## 2016-08-13 DIAGNOSIS — I10 Essential (primary) hypertension: Secondary | ICD-10-CM | POA: Diagnosis not present

## 2016-08-13 DIAGNOSIS — L511 Stevens-Johnson syndrome: Secondary | ICD-10-CM | POA: Diagnosis present

## 2016-08-13 LAB — PROTIME-INR
INR: 0.98
Prothrombin Time: 13 seconds (ref 11.4–15.2)

## 2016-08-13 NOTE — ED Notes (Signed)
Contacted [facility] for tx; dermatology consult
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
## 2016-08-13 NOTE — ED Provider Notes (Signed)
Hospitalist requesting transfer for dermatology given concerned for early STS and possible ITP. I have examined the patient. Lesions are limited to the mouth and lip. No skin lesions. She is hemodynamically stable. She also has evidence of new thrombocytopenia as well as leukopenia. Discussed with Dr. Mindi Junker at [facility]. She has been accepted to [facility].   Shon Baton, MD 08/13/16 [phone]

## 2017-01-02 ENCOUNTER — Encounter (HOSPITAL_COMMUNITY): Payer: Self-pay

## 2017-01-02 ENCOUNTER — Ambulatory Visit (HOSPITAL_COMMUNITY)
Admission: EM | Admit: 2017-01-02 | Discharge: 2017-01-02 | Disposition: A | Discharge status: Home / Self Care | Payer: BLUE CROSS/BLUE SHIELD | Attending: Internal Medicine | Admitting: Internal Medicine

## 2017-01-02 DIAGNOSIS — S46212A Strain of muscle, fascia and tendon of other parts of biceps, left arm, initial encounter: Secondary | ICD-10-CM

## 2017-01-02 DIAGNOSIS — M79605 Pain in left leg: Secondary | ICD-10-CM

## 2017-01-02 DIAGNOSIS — M79602 Pain in left arm: Secondary | ICD-10-CM

## 2017-01-02 MED ORDER — CYCLOBENZAPRINE HCL 10 MG PO TABS: 10.0000 mg | ORAL_TABLET | Freq: Two times a day (BID) | ORAL | 0 refills | Status: DC | PRN

## 2017-01-02 MED ORDER — NAPROXEN 500 MG PO TABS: 500.0000 mg | ORAL_TABLET | Freq: Two times a day (BID) | ORAL | 0 refills | Status: DC

## 2017-01-02 MED ORDER — CYCLOBENZAPRINE HCL 10 MG PO TABS: 10.0000 mg | ORAL_TABLET | Freq: Two times a day (BID) | ORAL | 0 refills | Status: AC | PRN
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
## 2017-07-01 ENCOUNTER — Other Ambulatory Visit: Payer: Self-pay | Admitting: Family Medicine

## 2017-07-01 DIAGNOSIS — Z1231 Encounter for screening mammogram for malignant neoplasm of breast: Secondary | ICD-10-CM

## 2017-07-17 ENCOUNTER — Ambulatory Visit: Payer: BLUE CROSS/BLUE SHIELD

## 2017-07-29 ENCOUNTER — Inpatient Hospital Stay: Admission: RE | Admit: 2017-07-29 | Payer: BLUE CROSS/BLUE SHIELD | Source: Ambulatory Visit

## 2017-12-07 ENCOUNTER — Ambulatory Visit
Admission: RE | Admit: 2017-12-07 | Discharge: 2017-12-07 | Disposition: A | Discharge status: Home / Self Care | Payer: BLUE CROSS/BLUE SHIELD | Source: Ambulatory Visit | Attending: Family Medicine | Admitting: Family Medicine

## 2017-12-07 DIAGNOSIS — Z1231 Encounter for screening mammogram for malignant neoplasm of breast: Secondary | ICD-10-CM

## 2018-04-26 ENCOUNTER — Ambulatory Visit (HOSPITAL_COMMUNITY)
Admission: EM | Admit: 2018-04-26 | Discharge: 2018-04-26 | Disposition: A | Discharge status: Home / Self Care | Payer: BLUE CROSS/BLUE SHIELD | Attending: Family Medicine | Admitting: Family Medicine

## 2018-04-26 ENCOUNTER — Encounter (HOSPITAL_COMMUNITY): Payer: Self-pay | Admitting: Emergency Medicine

## 2018-04-26 ENCOUNTER — Other Ambulatory Visit: Payer: Self-pay

## 2018-04-26 DIAGNOSIS — H5789 Other specified disorders of eye and adnexa: Secondary | ICD-10-CM

## 2018-04-26 MED ORDER — POLYETHYL GLYCOL-PROPYL GLYCOL 0.4-0.3 % OP SOLN: 1.0000 [drp] | Freq: Four times a day (QID) | OPHTHALMIC | 0 refills | Status: AC | PRN

## 2018-04-26 MED ORDER — POLYETHYL GLYCOL-PROPYL GLYCOL 0.4-0.3 % OP GEL: 1.0000 "application " | Freq: Every evening | OPHTHALMIC | 0 refills | Status: AC | PRN

## 2018-04-26 MED ORDER — POLYETHYL GLYCOL-PROPYL GLYCOL 0.4-0.3 % OP SOLN: 1.0000 [drp] | Freq: Four times a day (QID) | OPHTHALMIC | 0 refills | Status: DC | PRN

## 2018-04-26 MED ORDER — POLYETHYL GLYCOL-PROPYL GLYCOL 0.4-0.3 % OP GEL: 1.0000 "application " | Freq: Every evening | OPHTHALMIC | 0 refills | Status: DC | PRN

## 2018-04-26 NOTE — ED Provider Notes (Signed)
[facility]    CSN: [id number] Arrival date & time: 04/26/18  1819     History   Chief Complaint Chief Complaint  Patient presents with  . Eye Problem    HPI Cassandra Case is a 67 y.o. female.   67 year old female comes in for 4-day history of left eye irritation.  States there is foreign body sensation.  Has intermittent blurry vision.  Denies scotoma, photophobia, eye discharge, eye redness.  Denies injury/trauma.  Denies obvious foreign body getting into the eye.  She has been using tap water tried to flush the eye without relief.  States she saw her eye doctor a month ago, was given eyedrops for dry eyes and has been using with temporary relief.  Denies URI symptoms such as cough, congestion, sore throat.  Denies fever, chills, night sweats.  Denies sneezing.  Does have intermittent eye itching without watering.  States does apply make-up occasionally.  Denies contact lens use, does wear glasses.     Past Medical History:  Diagnosis Date  . Arthritis   . Chronic back pain    hx of buldging disc  . Hypertension    takes Maxzide daily  . Joint pain   . Joint swelling   . Rheumatoid arthritis(714.0)     Patient Active Problem List   Diagnosis Date Noted  . Stevens-Johnson syndrome (HCC) 08/13/2016  . Thrombocytopenia (HCC) 08/13/2016  . OA (osteoarthritis) of hip 01/18/2013    Class: Chronic  . Hypokalemia 08/14/2012    Class: Acute  . DJD (degenerative joint disease) of hip 08/12/2012    Class: Chronic    Past Surgical History:  Procedure Laterality Date  . ABDOMINAL HYSTERECTOMY    . BACK SURGERY  1987/2013  . COLONOSCOPY    . COLONOSCOPY WITH PROPOFOL N/A 11/27/2014   Procedure: COLONOSCOPY WITH PROPOFOL;  Surgeon: Charolett Bumpers, MD;  Location: [facility];  Service: Endoscopy;  Laterality: N/A;  . HERNIA REPAIR     at age 88-umbilical  . JOINT REPLACEMENT    . TONSILLECTOMY     at age 64  . TOTAL HIP ARTHROPLASTY  08/12/2012   Procedure: TOTAL HIP ARTHROPLASTY ANTERIOR APPROACH;  Surgeon: Velna Ochs, MD;  Location: [facility];  Service: Orthopedics;  Laterality: Right;  . TOTAL HIP ARTHROPLASTY Left 01/18/2013   Procedure: TOTAL HIP ARTHROPLASTY ANTERIOR APPROACH;  Surgeon: Velna Ochs, MD;  Location: [facility];  Service: Orthopedics;  Laterality: Left;    OB History   None      Home Medications    Prior to Admission medications   Medication Sig Start Date End Date Taking? Authorizing Provider  folic acid (FOLVITE) 1 MG tablet Take 1 mg by mouth daily.   Yes [provider]  PREDNISONE PO Take by mouth.   Yes [provider]  triamterene-hydrochlorothiazide (MAXZIDE) 75-50 MG per tablet Take 1 tablet by mouth daily. Reported on 11/15/2015   Yes [provider]  cyclobenzaprine (FLEXERIL) 10 MG tablet Take 1 tablet (10 mg total) by mouth 2 (two) times daily as needed for muscle spasms. 01/02/17   Deatra Canter, FNP  meloxicam (MOBIC) 15 MG tablet Take 15 mg by mouth daily.    [provider]  Polyethyl Glycol-Propyl Glycol (SYSTANE) 0.4-0.3 % GEL ophthalmic gel Place 1 application into both eyes at bedtime as needed. 04/26/18   Cathie Hoops, Aleiah Mohammed V, PA-C  Polyethyl Glycol-Propyl Glycol (SYSTANE) 0.4-0.3 % SOLN Apply 1-2 drops to eye 4 (four) times daily  as needed. 04/26/18   Cathie Hoops, Haleema Vanderheyden V, PA-C  potassium chloride SA (K-DUR,KLOR-CON) 20 MEQ tablet Take 20 mEq by mouth daily. Reported on 11/15/2015    [provider]    Family History Family History  Problem Relation Age of Onset  . Breast cancer Cousin        in 17's  . Breast cancer Cousin        in 22's    Social History Social History   Tobacco Use  . Smoking status: Never Smoker  . Smokeless tobacco: Never Used  Substance Use Topics  . Alcohol use: No  . Drug use: No     Allergies   Methotrexate derivatives and Vicodin [hydrocodone-acetaminophen]   Review of Systems Review of Systems  Reason unable to  perform ROS: See HPI as above.     Physical Exam Triage Vital Signs ED Triage Vitals  Enc Vitals Group     BP 04/26/18 1849 (!) 173/83     Pulse Rate 04/26/18 1849 60     Resp 04/26/18 1849 18     Temp 04/26/18 1849 98.1 F (36.7 C)     Temp Source 04/26/18 1849 Oral     SpO2 04/26/18 1849 98 %     Weight --      Height --      Head Circumference --      Peak Flow --      Pain Score 04/26/18 1846 0     Pain Loc --      Pain Edu? --      Excl. in GC? --    No data found.  Updated Vital Signs BP (!) 173/83 (BP Location: Left Arm)   Pulse 60   Temp 98.1 F (36.7 C) (Oral)   Resp 18   SpO2 98%   Visual Acuity Right Eye Distance: 20/40 Left Eye Distance: 20/50 Bilateral Distance: 20/30  Right Eye Near:   Left Eye Near:    Bilateral Near:     Physical Exam  Constitutional: She is oriented to person, place, and time. She appears well-developed and well-nourished. No distress.  HENT:  Head: Normocephalic and atraumatic.  Eyes: Pupils are equal, round, and reactive to light. Conjunctivae, EOM and lids are normal. Lids are everted and swept, no foreign bodies found. Left eye exhibits no discharge. No foreign body present in the left eye.  Cardiovascular: Normal rate, regular rhythm and normal heart sounds. Exam reveals no gallop and no friction rub.  No murmur heard. Pulmonary/Chest: Effort normal and breath sounds normal. No accessory muscle usage or stridor. No respiratory distress. She has no decreased breath sounds. She has no wheezes. She has no rhonchi. She has no rales.  Neurological: She is alert and oriented to person, place, and time.     UC Treatments / Results  Labs (all labs ordered are listed, but only abnormal results are displayed) Labs Reviewed - No data to display  EKG None  Radiology No results found.  Procedures Procedures (including critical care time)  Medications Ordered in UC Medications - No data to display  Initial Impression /  Assessment and Plan / UC Course  I have reviewed the triage vital signs and the nursing notes.  Pertinent labs & imaging results that were available during my care of the patient were reviewed by me and considered in my medical decision making (see chart for details).    Applied saline drops with good relief, states vision with mild improvement as well.  Artificial tears gel and drops as directed. Lid scrubs and warm compresses as directed. Patient to follow up with ophthalmology if symptoms worsens or does not improve. Return precautions given. Patient expresses understanding and agrees to plan.  Final Clinical Impressions(s) / UC Diagnoses   Final diagnoses:  Eye irritation    ED Prescriptions    Medication Sig Dispense Auth. Provider   Polyethyl Glycol-Propyl Glycol (SYSTANE) 0.4-0.3 % GEL ophthalmic gel  (Status: Discontinued) Place 1 application into both eyes at bedtime as needed. 1 Bottle Colton Engdahl V, PA-C   Polyethyl Glycol-Propyl Glycol (SYSTANE) 0.4-0.3 % SOLN  (Status: Discontinued) Apply 1-2 drops to eye 4 (four) times daily as needed. 1 Bottle Mersedes Alber V, PA-C   Polyethyl Glycol-Propyl Glycol (SYSTANE) 0.4-0.3 % GEL ophthalmic gel Place 1 application into both eyes at bedtime as needed. 1 Bottle Starletta Houchin V, PA-C   Polyethyl Glycol-Propyl Glycol (SYSTANE) 0.4-0.3 % SOLN Apply 1-2 drops to eye 4 (four) times daily as needed. 1 Bottle Threasa Alpha, PA-C 04/26/18 2000

## 2018-04-26 NOTE — Discharge Instructions (Addendum)
Artificial tear drops during the day. Artificial tear gel at night. I have called in the medicine, but over the counter, you can look for systane/genteal gel. Lid scrubs and warm compresses as directed. Monitor for any worsening of symptoms, changes in vision, sensitivity to light, eye swelling, painful eye movement, follow up with ophthalmology for further evaluation.

## 2018-04-26 NOTE — ED Triage Notes (Signed)
Left eye irritation since Friday.  Has flushed her eye and used some eye drops from a previous incident.  Eye drops called "soothe xp"

## 2018-09-11 ENCOUNTER — Ambulatory Visit (INDEPENDENT_AMBULATORY_CARE_PROVIDER_SITE_OTHER): Payer: BLUE CROSS/BLUE SHIELD

## 2018-09-11 ENCOUNTER — Encounter (HOSPITAL_COMMUNITY): Payer: Self-pay | Admitting: Internal Medicine

## 2018-09-11 ENCOUNTER — Ambulatory Visit (HOSPITAL_COMMUNITY)
Admission: EM | Admit: 2018-09-11 | Discharge: 2018-09-11 | Disposition: A | Discharge status: Home / Self Care | Payer: BLUE CROSS/BLUE SHIELD | Attending: Internal Medicine | Admitting: Internal Medicine

## 2018-09-11 DIAGNOSIS — M25551 Pain in right hip: Secondary | ICD-10-CM | POA: Diagnosis not present

## 2018-09-11 DIAGNOSIS — S39012A Strain of muscle, fascia and tendon of lower back, initial encounter: Secondary | ICD-10-CM

## 2018-09-11 DIAGNOSIS — M461 Sacroiliitis, not elsewhere classified: Secondary | ICD-10-CM

## 2018-09-11 MED ORDER — KETOROLAC TROMETHAMINE 60 MG/2ML IM SOLN
INTRAMUSCULAR | Status: AC
  Filled 2018-09-11: qty 2

## 2018-09-11 MED ORDER — KETOROLAC TROMETHAMINE 60 MG/2ML IM SOLN
60.0000 mg | Freq: Once | INTRAMUSCULAR | Status: AC
  Administered 2018-09-11: 60 mg via INTRAMUSCULAR

## 2018-09-11 NOTE — ED Triage Notes (Signed)
Pt presents with pain in lower back and right hip and leg.

## 2018-09-11 NOTE — Discharge Instructions (Addendum)
Do not use the heating pad for longer than 15 minutes. Do not apply on area of blister til it has healed fully. If you get more blisters without using the heating pad, could be you are geetting shingles which right now I dont suspect since you dont have light touch pain.  Take Tylenol 1000 mg every 6 hours for pain.   Continue using ice for 15-29 min. Try to walk more than lay around, since this helps with back pain.   See your family Dr or orthopedist next week.

## 2018-09-11 NOTE — ED Provider Notes (Signed)
[facility]    CSN: [id number] Arrival date & time: 09/11/18  1106     History   Chief Complaint Chief Complaint  Patient presents with  . Back Pain    Lower Back   . Leg Pain    Right    HPI Cassandra Case is a 67 y.o. female.   Pt feel 2 days ago on her knees while at home moving a table and wearing socks and is here due to having a lot of R SI/ hip pain and radiates to her R thigh. Has used ice and heat. Slept with a heating pad last night.  She took Ibuprofen 800 mg last night and Tylenol arthritis during the day yesterday. Neither one helped her pain. She is able to walk. Denies saddle paresthesia, leg paresthesia, or urinary or bowel incontinence. Has hx of lumbar DDD and had spine surgery years ago. Her total hip replacement was 6 years ago.      Past Medical History:  Diagnosis Date  . Arthritis   . Chronic back pain    hx of buldging disc  . Hypertension    takes Maxzide daily  . Joint pain   . Joint swelling   . Rheumatoid arthritis(714.0)     Patient Active Problem List   Diagnosis Date Noted  . Stevens-Johnson syndrome (HCC) 08/13/2016  . Thrombocytopenia (HCC) 08/13/2016  . OA (osteoarthritis) of hip 01/18/2013    Class: Chronic  . Hypokalemia 08/14/2012    Class: Acute  . DJD (degenerative joint disease) of hip 08/12/2012    Class: Chronic    Past Surgical History:  Procedure Laterality Date  . ABDOMINAL HYSTERECTOMY    . BACK SURGERY  1987/2013  . COLONOSCOPY    . COLONOSCOPY WITH PROPOFOL N/A 11/27/2014   Procedure: COLONOSCOPY WITH PROPOFOL;  Surgeon: Charolett Bumpers, MD;  Location: [facility];  Service: Endoscopy;  Laterality: N/A;  . HERNIA REPAIR     at age 89-umbilical  . JOINT REPLACEMENT    . TONSILLECTOMY     at age 89  . TOTAL HIP ARTHROPLASTY  08/12/2012   Procedure: TOTAL HIP ARTHROPLASTY ANTERIOR APPROACH;  Surgeon: Velna Ochs, MD;  Location: [facility];  Service: Orthopedics;  Laterality: Right;  . TOTAL HIP  ARTHROPLASTY Left 01/18/2013   Procedure: TOTAL HIP ARTHROPLASTY ANTERIOR APPROACH;  Surgeon: Velna Ochs, MD;  Location: [facility];  Service: Orthopedics;  Laterality: Left;    OB History   None      Home Medications    Prior to Admission medications   Medication Sig Start Date End Date Taking? Authorizing Provider  cyclobenzaprine (FLEXERIL) 10 MG tablet Take 1 tablet (10 mg total) by mouth 2 (two) times daily as needed for muscle spasms. 01/02/17   Deatra Canter, FNP  folic acid (FOLVITE) 1 MG tablet Take 1 mg by mouth daily.    [provider]  meloxicam (MOBIC) 15 MG tablet Take 15 mg by mouth daily.    [provider]  Polyethyl Glycol-Propyl Glycol (SYSTANE) 0.4-0.3 % GEL ophthalmic gel Place 1 application into both eyes at bedtime as needed. 04/26/18   Cathie Hoops, Amy V, PA-C  Polyethyl Glycol-Propyl Glycol (SYSTANE) 0.4-0.3 % SOLN Apply 1-2 drops to eye 4 (four) times daily as needed. 04/26/18   Cathie Hoops, Amy V, PA-C  potassium chloride SA (K-DUR,KLOR-CON) 20 MEQ tablet Take 20 mEq by mouth daily. Reported on 11/15/2015    [provider]  triamterene-hydrochlorothiazide (MAXZIDE) 75-50 MG  per tablet Take 1 tablet by mouth daily. Reported on 11/15/2015    [provider]    Family History Family History  Problem Relation Age of Onset  . Breast cancer Cousin        in 45's  . Breast cancer Cousin        in 28's    Social History Social History   Tobacco Use  . Smoking status: Never Smoker  . Smokeless tobacco: Never Used  Substance Use Topics  . Alcohol use: No  . Drug use: No     Allergies   Methotrexate derivatives and Vicodin [hydrocodone-acetaminophen]   Review of Systems Review of Systems  Constitutional: Negative for chills, diaphoresis and fever.  HENT: Negative.   Respiratory: Negative for cough.   Cardiovascular: Negative for leg swelling.  Gastrointestinal: Negative for abdominal pain.  Genitourinary: Negative for dysuria.    Musculoskeletal: Positive for back pain, gait problem and joint swelling.       Joint pain and R knee swelling from RA. See HPI  Skin: Negative for rash.       Has a blister she thinks form the heating pad  Neurological: Negative for weakness and numbness.  Psychiatric/Behavioral: Positive for sleep disturbance.       Due to pain     Physical Exam Triage Vital Signs ED Triage Vitals  Enc Vitals Group     BP      Pulse      Resp      Temp      Temp src      SpO2      Weight      Height      Head Circumference      Peak Flow      Pain Score      Pain Loc      Pain Edu?      Excl. in GC?    No data found.  Updated Vital Signs BP (!) 192/67 (BP Location: Right Arm)   Pulse (!) 55   Temp 97.8 F (36.6 C) (Oral)   Resp 20   SpO2 99%   Visual Acuity Right Eye Distance:   Left Eye Distance:   Bilateral Distance:    Right Eye Near:   Left Eye Near:    Bilateral Near:     Physical Exam  Constitutional: She is oriented to person, place, and time. She appears well-developed. No distress.  In moderate pain  HENT:  Head: Normocephalic.  Right Ear: External ear normal.  Left Ear: External ear normal.  Nose: Nose normal.  Eyes: Conjunctivae are normal. Scleral icterus is present.  Neck: Neck supple.  Pulmonary/Chest: Effort normal.  Musculoskeletal: She exhibits tenderness. She exhibits no edema.  KNEES- R with enlarged joint, but minimal tenderness and no abrasions or ecchymosis, L knee with 2x3 cm ecchymosis on the L , over joint line. ROM of knee is normal, has minimal pain and only with palpation of bruise area.  HIP- ROM is normal on both sides, but internal and external rotation provoked pain on her R lower back area. BACK- R lower back shows some erythema and a vesicle of 1x1 cm. Does not have hyperalgesia of this area. ( pt states is from the heating pad she slept on last night) Has pain on R SI region.   Neurological: She is alert and oriented to person,  place, and time.  Skin: Skin is warm and dry. No rash noted. She is not diaphoretic.  Psychiatric: She has a normal mood and affect. Her behavior is normal. Judgment and thought content normal.  Nursing note and vitals reviewed.    UC Treatments / Results  Labs (all labs ordered are listed, but only abnormal results are displayed) Labs Reviewed - No data to display  EKG None  Radiology R hip xray with pelvis- negative per radiologist.   Procedures None  Medications Ordered in UC Medications  ketorolac (TORADOL) injection 60 mg (60 mg Intramuscular Given 09/11/18 1235)    Initial Impression / Assessment and Plan / UC Course  I have reviewed the triage vital signs and the nursing notes. Pt strongly advised to not sleep with heating pad, only to use it for 15 min at a time, but needs to wait til blister has healed and not apply on this area.  I advised her to take her Meloxicam at  7 pm tonight. May take Tylenol 1000 mg every 6h for pain. FU with PCP or ortho next week.  Final Clinical Impressions(s) / UC Diagnoses   Final diagnoses:  Hip pain, acute, right  Sacroiliitis (HCC)  Strain of lumbar region, initial encounter     Discharge Instructions     Do not use the heating pad for longer than 15 minutes. Do not apply on area of blister til it has healed fully. If you get more blisters without using the heating pad, could be you are geetting shingles which right now I dont suspect since you dont have light touch pain.  Take Tylenol 1000 mg every 6 hours for pain.   Continue using ice for 15-29 min. Try to walk more than lay around, since this helps with back pain.   See your family Dr or orthopedist next week.     ED Prescriptions    None     Controlled Substance Prescriptions [place] Controlled Substance Registry consulted?  no   Garey Ham, [place] 09/11/18 1351

## 2018-09-11 NOTE — ED Notes (Signed)
Patient transported to X-ray 

## 2018-09-28 ENCOUNTER — Other Ambulatory Visit: Payer: Self-pay | Admitting: Orthopaedic Surgery

## 2018-09-28 ENCOUNTER — Ambulatory Visit
Admission: RE | Admit: 2018-09-28 | Discharge: 2018-09-28 | Disposition: A | Discharge status: Home / Self Care | Payer: BLUE CROSS/BLUE SHIELD | Source: Ambulatory Visit | Attending: Orthopaedic Surgery | Admitting: Orthopaedic Surgery

## 2018-09-28 DIAGNOSIS — M25551 Pain in right hip: Secondary | ICD-10-CM

## 2018-11-05 ENCOUNTER — Other Ambulatory Visit: Payer: Self-pay | Admitting: Family Medicine

## 2018-11-05 DIAGNOSIS — Z1231 Encounter for screening mammogram for malignant neoplasm of breast: Secondary | ICD-10-CM

## 2018-12-08 ENCOUNTER — Ambulatory Visit: Payer: BLUE CROSS/BLUE SHIELD

## 2019-01-04 ENCOUNTER — Ambulatory Visit: Payer: BLUE CROSS/BLUE SHIELD

## 2019-01-31 ENCOUNTER — Ambulatory Visit: Payer: BLUE CROSS/BLUE SHIELD

## 2019-03-15 ENCOUNTER — Ambulatory Visit
Admission: RE | Admit: 2019-03-15 | Discharge: 2019-03-15 | Disposition: A | Discharge status: Home / Self Care | Payer: BC Managed Care – PPO | Source: Ambulatory Visit | Attending: Family Medicine | Admitting: Family Medicine

## 2019-03-15 ENCOUNTER — Other Ambulatory Visit: Payer: Self-pay

## 2019-03-15 DIAGNOSIS — Z1231 Encounter for screening mammogram for malignant neoplasm of breast: Secondary | ICD-10-CM

## 2019-07-04 ENCOUNTER — Other Ambulatory Visit: Payer: Self-pay | Admitting: Family Medicine

## 2019-07-04 DIAGNOSIS — M8580 Other specified disorders of bone density and structure, unspecified site: Secondary | ICD-10-CM

## 2019-08-29 ENCOUNTER — Other Ambulatory Visit: Payer: Self-pay

## 2019-08-29 ENCOUNTER — Ambulatory Visit
Admission: RE | Admit: 2019-08-29 | Discharge: 2019-08-29 | Disposition: A | Discharge status: Home / Self Care | Payer: BC Managed Care – PPO | Source: Ambulatory Visit | Attending: Family Medicine | Admitting: Family Medicine

## 2019-08-29 DIAGNOSIS — M8580 Other specified disorders of bone density and structure, unspecified site: Secondary | ICD-10-CM

## 2019-12-17 ENCOUNTER — Ambulatory Visit: Payer: BC Managed Care – PPO | Attending: Internal Medicine

## 2019-12-17 DIAGNOSIS — Z23 Encounter for immunization: Secondary | ICD-10-CM | POA: Insufficient documentation

## 2019-12-17 NOTE — Progress Notes (Signed)
   Covid-19 Vaccination Clinic  Name:  MAKALYNN BERWANGER    MRN: [id number] DOB: 02/27/1951  12/17/2019  Ms. Kutner was observed post Covid-19 immunization for 15 minutes without incident. She was provided with Vaccine Information Sheet and instruction to access the V-Safe system.   Ms. Gosdin was instructed to call 911 with any severe reactions post vaccine: Marland Kitchen Difficulty breathing  . Swelling of face and throat  . A fast heartbeat  . A bad rash all over body  . Dizziness and weakness   Immunizations Administered    Name Date Dose VIS Date Route   Pfizer COVID-19 Vaccine 12/17/2019  1:05 PM 0.3 mL 09/23/2019 Intramuscular   Manufacturer: [employer], [employer]   Lot: [id number]   NDC: [address]-0

## 2020-01-07 ENCOUNTER — Ambulatory Visit: Payer: BC Managed Care – PPO | Attending: Internal Medicine

## 2020-01-07 DIAGNOSIS — Z23 Encounter for immunization: Secondary | ICD-10-CM

## 2020-01-07 NOTE — Progress Notes (Signed)
   Covid-19 Vaccination Clinic  Name:  JAX ABDELRAHMAN    MRN: [id number] DOB: Jun 18, 1951  01/07/2020  Ms. Skeen was observed post Covid-19 immunization for 15 minutes without incident. She was provided with Vaccine Information Sheet and instruction to access the V-Safe system.   Ms. Griffing was instructed to call 911 with any severe reactions post vaccine: Marland Kitchen Difficulty breathing  . Swelling of face and throat  . A fast heartbeat  . A bad rash all over body  . Dizziness and weakness   Immunizations Administered    Name Date Dose VIS Date Route   Pfizer COVID-19 Vaccine 01/07/2020  2:34 PM 0.3 mL 09/23/2019 Intramuscular   Manufacturer: [employer], [employer]   Lot: [id number]   NDC: [address]-2

## 2020-05-31 ENCOUNTER — Other Ambulatory Visit: Payer: Self-pay | Admitting: Family Medicine

## 2020-05-31 DIAGNOSIS — Z1231 Encounter for screening mammogram for malignant neoplasm of breast: Secondary | ICD-10-CM

## 2020-06-12 ENCOUNTER — Ambulatory Visit
Admission: RE | Admit: 2020-06-12 | Discharge: 2020-06-12 | Disposition: A | Discharge status: Home / Self Care | Payer: Medicare Other | Source: Ambulatory Visit | Attending: Family Medicine | Admitting: Family Medicine

## 2020-06-12 ENCOUNTER — Other Ambulatory Visit: Payer: Self-pay

## 2020-06-12 DIAGNOSIS — Z1231 Encounter for screening mammogram for malignant neoplasm of breast: Secondary | ICD-10-CM

## 2020-06-21 ENCOUNTER — Ambulatory Visit (HOSPITAL_COMMUNITY)
Admission: EM | Admit: 2020-06-21 | Discharge: 2020-06-21 | Disposition: A | Discharge status: Home / Self Care | Payer: Medicare Other | Attending: Emergency Medicine | Admitting: Emergency Medicine

## 2020-06-21 ENCOUNTER — Other Ambulatory Visit: Payer: Self-pay

## 2020-06-21 DIAGNOSIS — Z20822 Contact with and (suspected) exposure to covid-19: Secondary | ICD-10-CM | POA: Diagnosis present

## 2020-06-21 LAB — SARS CORONAVIRUS 2 (TAT 6-24 HRS): SARS Coronavirus 2: NEGATIVE

## 2020-06-21 NOTE — ED Triage Notes (Signed)
Pt presents for COVID test after exposure with son that tested positive and lives with pt. Denies any symptoms at this time.

## 2020-06-21 NOTE — Discharge Instructions (Signed)

## 2020-11-07 DIAGNOSIS — I10 Essential (primary) hypertension: Secondary | ICD-10-CM | POA: Diagnosis not present

## 2020-11-07 DIAGNOSIS — M791 Myalgia, unspecified site: Secondary | ICD-10-CM | POA: Diagnosis not present

## 2020-11-07 DIAGNOSIS — R109 Unspecified abdominal pain: Secondary | ICD-10-CM | POA: Diagnosis not present

## 2020-11-29 DIAGNOSIS — S32810D Multiple fractures of pelvis with stable disruption of pelvic ring, subsequent encounter for fracture with routine healing: Secondary | ICD-10-CM | POA: Diagnosis not present

## 2020-11-29 DIAGNOSIS — M069 Rheumatoid arthritis, unspecified: Secondary | ICD-10-CM | POA: Diagnosis not present

## 2020-11-29 DIAGNOSIS — M2550 Pain in unspecified joint: Secondary | ICD-10-CM | POA: Diagnosis not present

## 2020-11-29 DIAGNOSIS — Z79899 Other long term (current) drug therapy: Secondary | ICD-10-CM | POA: Diagnosis not present

## 2020-11-29 DIAGNOSIS — Z7189 Other specified counseling: Secondary | ICD-10-CM | POA: Diagnosis not present

## 2021-02-28 DIAGNOSIS — M2550 Pain in unspecified joint: Secondary | ICD-10-CM | POA: Diagnosis not present

## 2021-02-28 DIAGNOSIS — Z6824 Body mass index (BMI) 24.0-24.9, adult: Secondary | ICD-10-CM | POA: Diagnosis not present

## 2021-02-28 DIAGNOSIS — Z7189 Other specified counseling: Secondary | ICD-10-CM | POA: Diagnosis not present

## 2021-02-28 DIAGNOSIS — S32810D Multiple fractures of pelvis with stable disruption of pelvic ring, subsequent encounter for fracture with routine healing: Secondary | ICD-10-CM | POA: Diagnosis not present

## 2021-02-28 DIAGNOSIS — M069 Rheumatoid arthritis, unspecified: Secondary | ICD-10-CM | POA: Diagnosis not present

## 2021-02-28 DIAGNOSIS — Z79899 Other long term (current) drug therapy: Secondary | ICD-10-CM | POA: Diagnosis not present

## 2021-03-12 DIAGNOSIS — R7989 Other specified abnormal findings of blood chemistry: Secondary | ICD-10-CM | POA: Diagnosis not present

## 2021-03-18 DIAGNOSIS — Z96643 Presence of artificial hip joint, bilateral: Secondary | ICD-10-CM | POA: Diagnosis not present

## 2021-03-18 DIAGNOSIS — Z09 Encounter for follow-up examination after completed treatment for conditions other than malignant neoplasm: Secondary | ICD-10-CM | POA: Diagnosis not present

## 2021-03-28 DIAGNOSIS — S32810D Multiple fractures of pelvis with stable disruption of pelvic ring, subsequent encounter for fracture with routine healing: Secondary | ICD-10-CM | POA: Diagnosis not present

## 2021-03-28 DIAGNOSIS — Z7189 Other specified counseling: Secondary | ICD-10-CM | POA: Diagnosis not present

## 2021-03-28 DIAGNOSIS — Z79899 Other long term (current) drug therapy: Secondary | ICD-10-CM | POA: Diagnosis not present

## 2021-03-28 DIAGNOSIS — M2550 Pain in unspecified joint: Secondary | ICD-10-CM | POA: Diagnosis not present

## 2021-03-28 DIAGNOSIS — M069 Rheumatoid arthritis, unspecified: Secondary | ICD-10-CM | POA: Diagnosis not present

## 2021-03-28 DIAGNOSIS — Z6823 Body mass index (BMI) 23.0-23.9, adult: Secondary | ICD-10-CM | POA: Diagnosis not present

## 2021-05-09 DIAGNOSIS — M069 Rheumatoid arthritis, unspecified: Secondary | ICD-10-CM | POA: Diagnosis not present

## 2021-05-09 DIAGNOSIS — Z79899 Other long term (current) drug therapy: Secondary | ICD-10-CM | POA: Diagnosis not present

## 2021-05-24 ENCOUNTER — Other Ambulatory Visit: Payer: Self-pay | Admitting: Family Medicine

## 2021-05-24 DIAGNOSIS — Z1231 Encounter for screening mammogram for malignant neoplasm of breast: Secondary | ICD-10-CM

## 2021-05-28 DIAGNOSIS — M069 Rheumatoid arthritis, unspecified: Secondary | ICD-10-CM | POA: Diagnosis not present

## 2021-05-28 DIAGNOSIS — Z79899 Other long term (current) drug therapy: Secondary | ICD-10-CM | POA: Diagnosis not present

## 2021-06-04 DIAGNOSIS — H25813 Combined forms of age-related cataract, bilateral: Secondary | ICD-10-CM | POA: Diagnosis not present

## 2021-06-13 ENCOUNTER — Other Ambulatory Visit: Payer: Self-pay

## 2021-06-13 ENCOUNTER — Ambulatory Visit
Admission: RE | Admit: 2021-06-13 | Discharge: 2021-06-13 | Disposition: A | Discharge status: Home / Self Care | Payer: Medicare Other | Source: Ambulatory Visit | Attending: Family Medicine | Admitting: Family Medicine

## 2021-06-13 DIAGNOSIS — Z1231 Encounter for screening mammogram for malignant neoplasm of breast: Secondary | ICD-10-CM

## 2021-07-02 DIAGNOSIS — Z79899 Other long term (current) drug therapy: Secondary | ICD-10-CM | POA: Diagnosis not present

## 2021-07-02 DIAGNOSIS — M069 Rheumatoid arthritis, unspecified: Secondary | ICD-10-CM | POA: Diagnosis not present

## 2021-07-02 DIAGNOSIS — S32810D Multiple fractures of pelvis with stable disruption of pelvic ring, subsequent encounter for fracture with routine healing: Secondary | ICD-10-CM | POA: Diagnosis not present

## 2021-07-02 DIAGNOSIS — Z7189 Other specified counseling: Secondary | ICD-10-CM | POA: Diagnosis not present

## 2021-07-02 DIAGNOSIS — R5382 Chronic fatigue, unspecified: Secondary | ICD-10-CM | POA: Diagnosis not present

## 2021-07-02 DIAGNOSIS — M2550 Pain in unspecified joint: Secondary | ICD-10-CM | POA: Diagnosis not present

## 2021-07-02 DIAGNOSIS — Z6823 Body mass index (BMI) 23.0-23.9, adult: Secondary | ICD-10-CM | POA: Diagnosis not present

## 2021-07-16 DIAGNOSIS — M899 Disorder of bone, unspecified: Secondary | ICD-10-CM | POA: Diagnosis not present

## 2021-07-16 DIAGNOSIS — D61818 Other pancytopenia: Secondary | ICD-10-CM | POA: Diagnosis not present

## 2021-07-16 DIAGNOSIS — I10 Essential (primary) hypertension: Secondary | ICD-10-CM | POA: Diagnosis not present

## 2021-07-16 DIAGNOSIS — Z0000 Encounter for general adult medical examination without abnormal findings: Secondary | ICD-10-CM | POA: Diagnosis not present

## 2021-07-16 DIAGNOSIS — M069 Rheumatoid arthritis, unspecified: Secondary | ICD-10-CM | POA: Diagnosis not present

## 2021-07-16 DIAGNOSIS — G4700 Insomnia, unspecified: Secondary | ICD-10-CM | POA: Diagnosis not present

## 2021-10-02 IMAGING — MG MM DIGITAL SCREENING BILAT W/ TOMO AND CAD
6 of 10 series · 6 of 30 positions shown · non-contrast
Comparison: Previous exam(s).

CLINICAL DATA: Screening.

EXAM:
DIGITAL SCREENING BILATERAL MAMMOGRAM WITH TOMOSYNTHESIS AND CAD
TECHNIQUE: Bilateral screening digital craniocaudal and mediolateral oblique
mammograms were obtained. Bilateral screening digital breast
tomosynthesis was performed. The images were evaluated with
computer-aided detection.

[R CC synth-2D (1 of 2)]
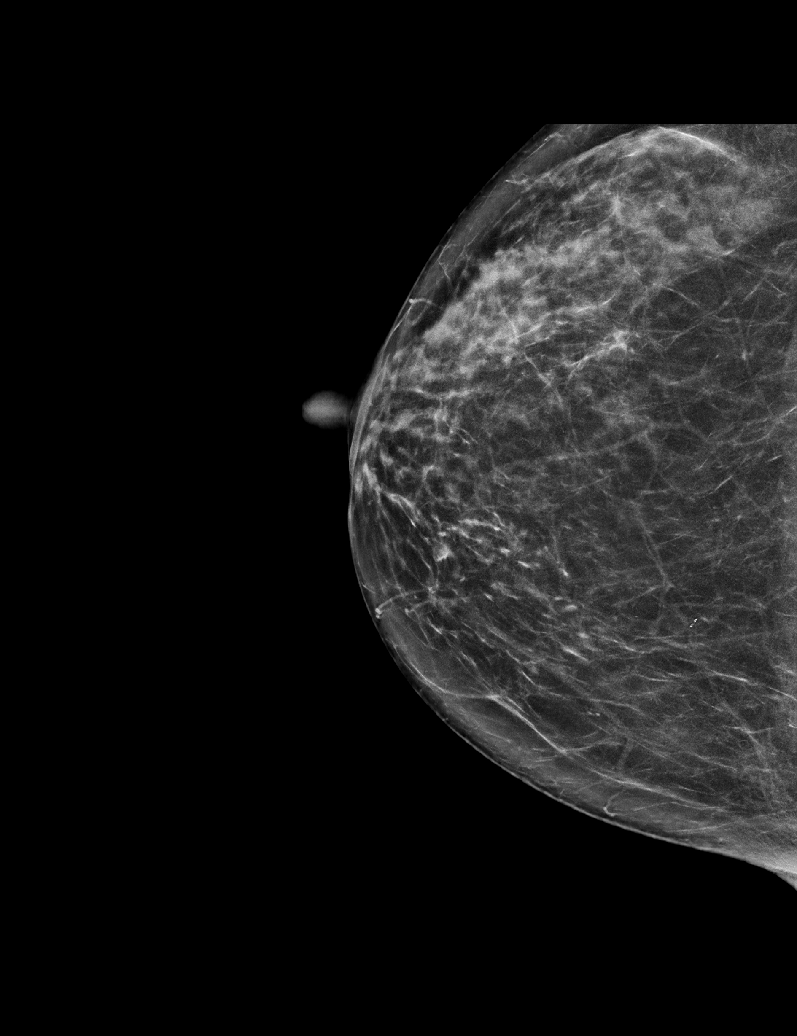

[L MLO synth-2D]
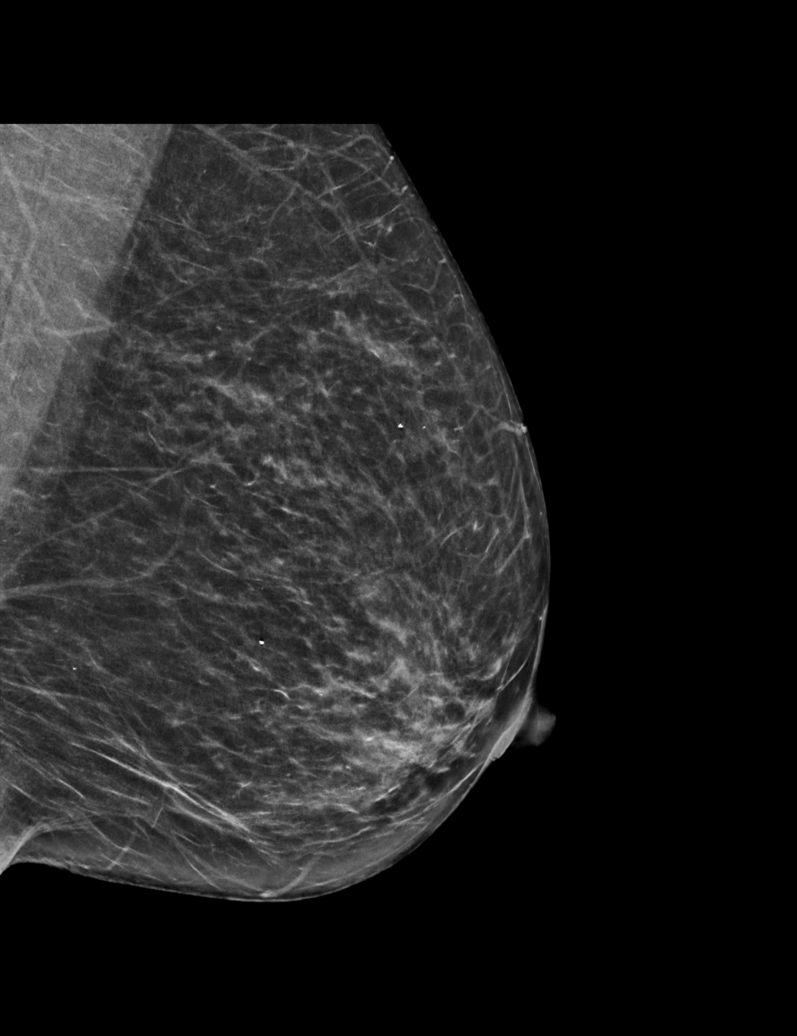

[R MLO synth-2D]
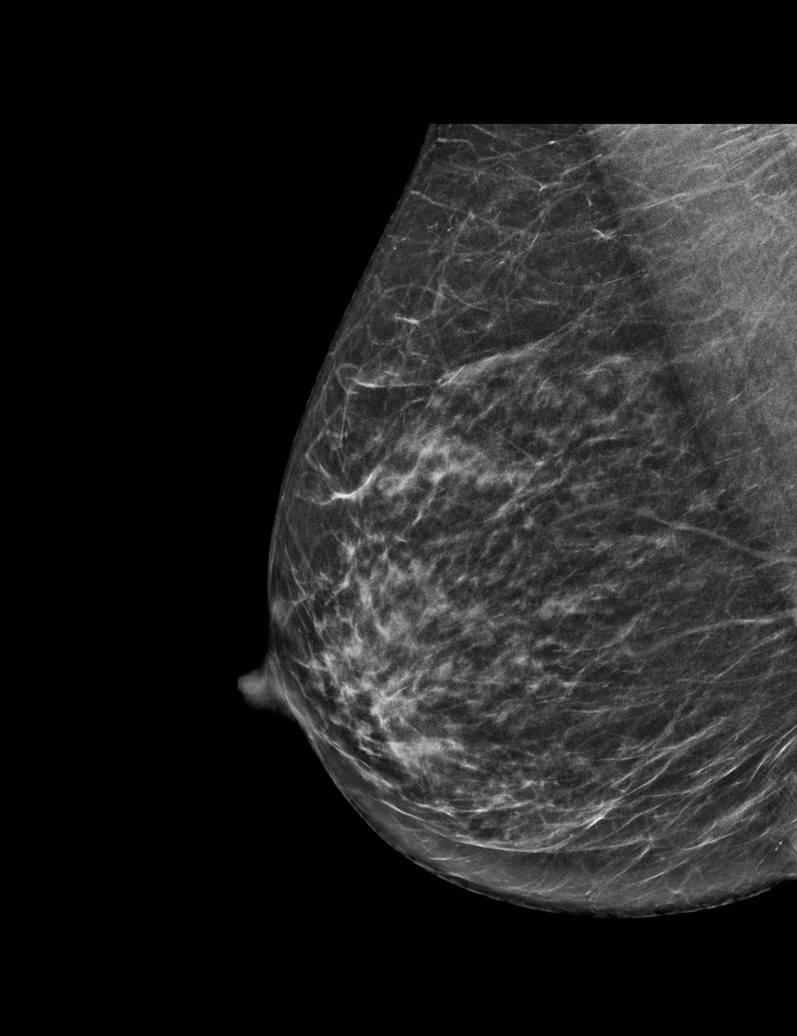

[L CC synth-2D]
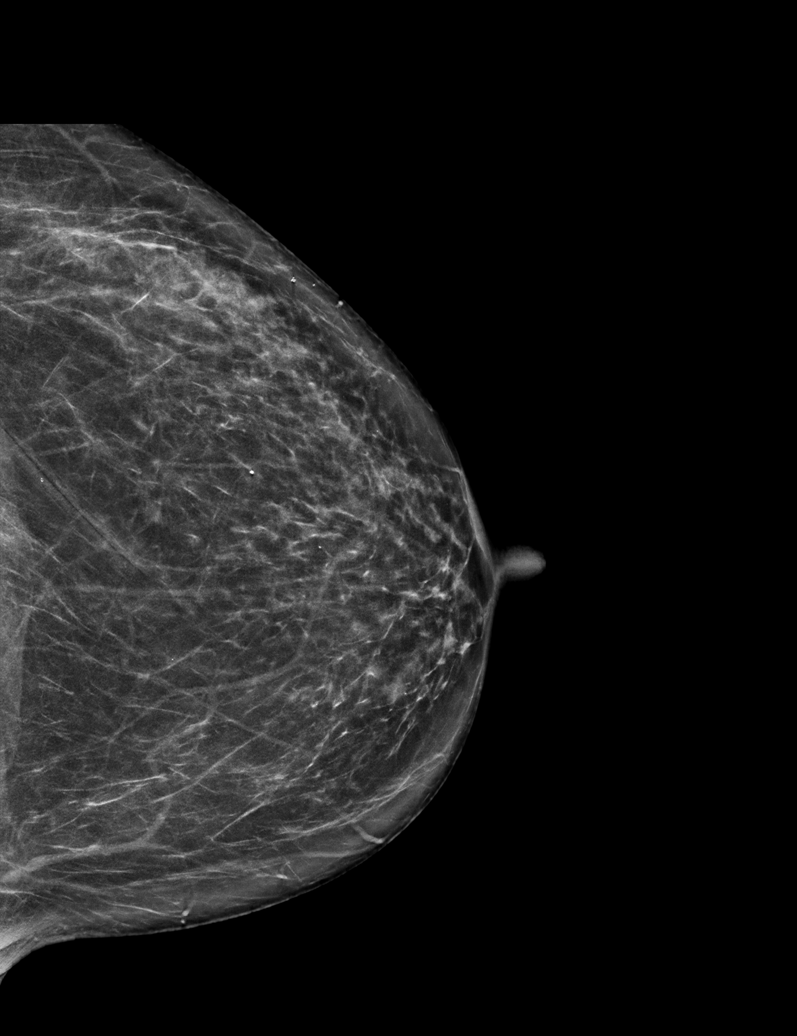

[R CC synth-2D (2 of 2)]
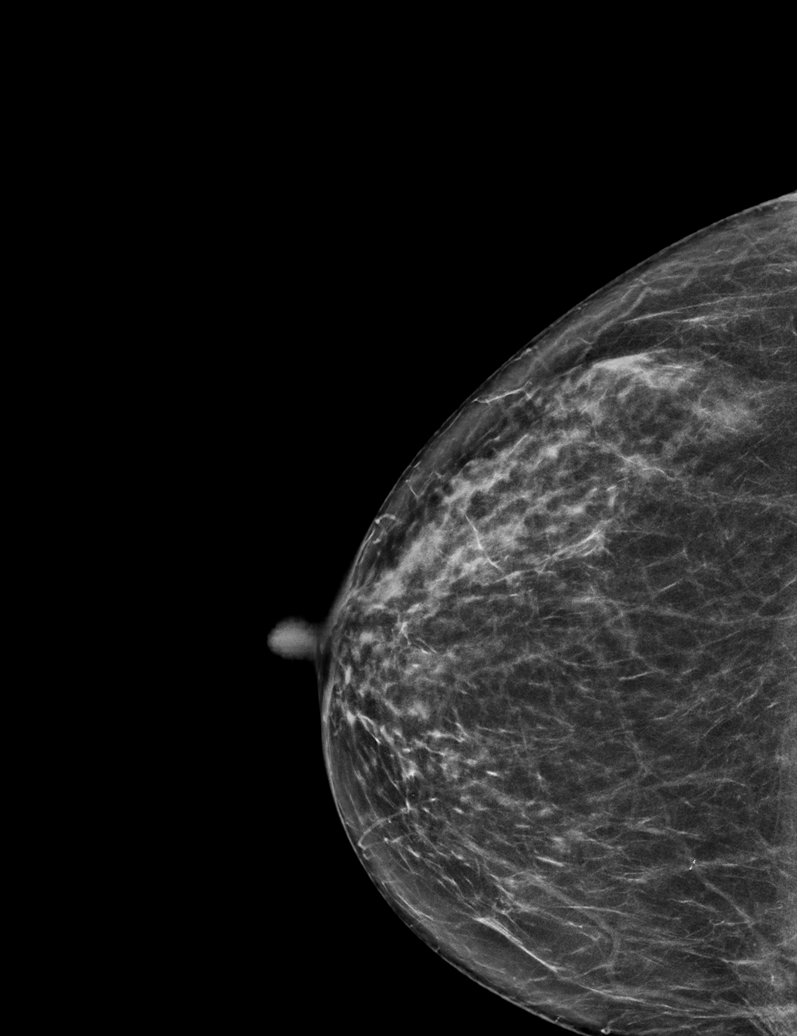

[L MLO tomo · tomo slice 29/58.0]
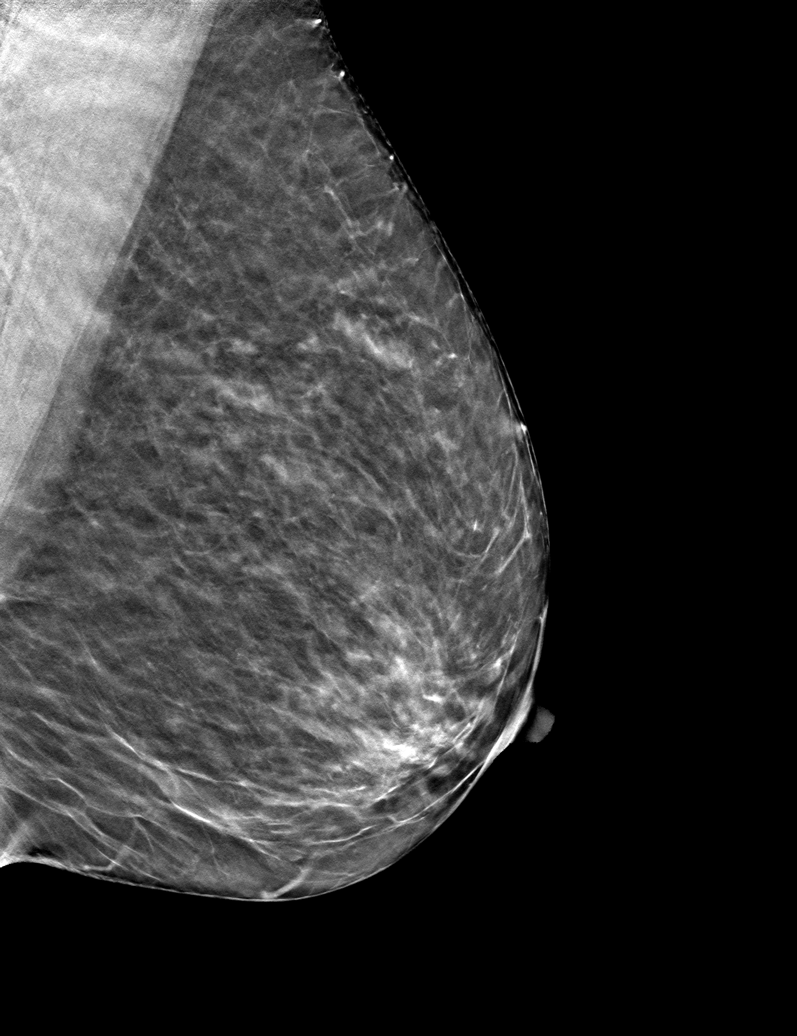

[6 of 30 positions shown; findings below may reference images not displayed]

ACR Breast Density Category b: There are scattered areas of
fibroglandular density.
FINDINGS: There are no findings suspicious for malignancy.
IMPRESSION: No mammographic evidence of malignancy. A result letter of this
screening mammogram will be mailed directly to the patient.

RECOMMENDATION:
Screening mammogram in one year. (Code:51-O-LD2)

BI-RADS CATEGORY  1: Negative.

## 2021-10-03 DIAGNOSIS — Z6824 Body mass index (BMI) 24.0-24.9, adult: Secondary | ICD-10-CM | POA: Diagnosis not present

## 2021-10-03 DIAGNOSIS — Z79899 Other long term (current) drug therapy: Secondary | ICD-10-CM | POA: Diagnosis not present

## 2021-10-03 DIAGNOSIS — M2550 Pain in unspecified joint: Secondary | ICD-10-CM | POA: Diagnosis not present

## 2021-10-03 DIAGNOSIS — S32810D Multiple fractures of pelvis with stable disruption of pelvic ring, subsequent encounter for fracture with routine healing: Secondary | ICD-10-CM | POA: Diagnosis not present

## 2021-10-03 DIAGNOSIS — M069 Rheumatoid arthritis, unspecified: Secondary | ICD-10-CM | POA: Diagnosis not present

## 2021-10-03 DIAGNOSIS — Z7189 Other specified counseling: Secondary | ICD-10-CM | POA: Diagnosis not present

## 2022-01-01 DIAGNOSIS — S32810D Multiple fractures of pelvis with stable disruption of pelvic ring, subsequent encounter for fracture with routine healing: Secondary | ICD-10-CM | POA: Diagnosis not present

## 2022-01-01 DIAGNOSIS — M2550 Pain in unspecified joint: Secondary | ICD-10-CM | POA: Diagnosis not present

## 2022-01-01 DIAGNOSIS — M069 Rheumatoid arthritis, unspecified: Secondary | ICD-10-CM | POA: Diagnosis not present

## 2022-01-01 DIAGNOSIS — Z79899 Other long term (current) drug therapy: Secondary | ICD-10-CM | POA: Diagnosis not present

## 2022-03-08 ENCOUNTER — Ambulatory Visit (HOSPITAL_COMMUNITY)
Admission: EM | Admit: 2022-03-08 | Discharge: 2022-03-08 | Disposition: A | Discharge status: Home / Self Care | Payer: Medicare Other | Attending: Internal Medicine | Admitting: Internal Medicine

## 2022-03-08 ENCOUNTER — Encounter (HOSPITAL_COMMUNITY): Payer: Self-pay | Admitting: Emergency Medicine

## 2022-03-08 DIAGNOSIS — H60392 Other infective otitis externa, left ear: Secondary | ICD-10-CM

## 2022-03-08 DIAGNOSIS — J301 Allergic rhinitis due to pollen: Secondary | ICD-10-CM

## 2022-03-08 DIAGNOSIS — I10 Essential (primary) hypertension: Secondary | ICD-10-CM

## 2022-03-08 MED ORDER — CETIRIZINE HCL 10 MG PO TABS: 10.0000 mg | ORAL_TABLET | Freq: Every day | ORAL | 2 refills | Status: AC

## 2022-03-08 MED ORDER — CIPROFLOXACIN-DEXAMETHASONE 0.3-0.1 % OT SUSP: 4.0000 [drp] | Freq: Two times a day (BID) | OTIC | 0 refills | Status: AC

## 2022-03-08 MED ORDER — FLUTICASONE PROPIONATE 50 MCG/ACT NA SUSP: 1.0000 | Freq: Every day | NASAL | 2 refills | Status: AC

## 2022-03-08 NOTE — ED Provider Notes (Signed)
[place]    CSN: [id number] Arrival date & time: 03/08/22  1030      History   Chief Complaint Chief Complaint  Patient presents with   Cerumen Impaction    HPI Cassandra Case is a 71 y.o. female.   Patient presents to urgent care for evaluation of her left ear discomfort, nasal congestion/drainage, eye itching, and postnasal drainage that she has had for the last few days.  Ear discomfort started yesterday.  She thought that she had earwax in her ear so she used Q-tips to try to clean out her ears.  Denies decreased hearing, ear drainage, and headache.  States her nasal congestion, eye itch, and postnasal drainage got worse after she spent time outside a few days ago watching a softball game.  She has been using over-the-counter eyedrops for eye itch that have helped with her eye itching intermittently.  Denies history of asthma, COPD, and allergic rhinitis.  She has not had a fever/chills at home and is afebrile in the clinic today.  Denies urinary symptoms, dizziness, nausea, abdominal pain, sore throat, headache, cough, shortness of breath, and chest pain.  No other aggravating or relieving factors identified at this time.    Past Medical History:  Diagnosis Date   Arthritis    Chronic back pain    hx of buldging disc   Hypertension    takes Maxzide daily   Joint pain    Joint swelling    Rheumatoid arthritis(714.0)     Patient Active Problem List   Diagnosis Date Noted   Stevens-Johnson syndrome ([place]) 08/13/2016   Thrombocytopenia ([place]) 08/13/2016   OA (osteoarthritis) of hip 01/18/2013    Class: Chronic   Hypokalemia 08/14/2012    Class: Acute   DJD (degenerative joint disease) of hip 08/12/2012    Class: Chronic    Past Surgical History:  Procedure Laterality Date   ABDOMINAL HYSTERECTOMY     BACK SURGERY  1987/2013   COLONOSCOPY     COLONOSCOPY WITH PROPOFOL N/A 11/27/2014   Procedure: COLONOSCOPY WITH PROPOFOL;  Surgeon: Garlan Fair, MD;   Location: [facility];  Service: Endoscopy;  Laterality: N/A;   HERNIA REPAIR     at age 89-umbilical   JOINT REPLACEMENT     TONSILLECTOMY     at age 36   TOTAL HIP ARTHROPLASTY  08/12/2012   Procedure: TOTAL HIP ARTHROPLASTY ANTERIOR APPROACH;  Surgeon: Hessie Dibble, MD;  Location: [facility];  Service: Orthopedics;  Laterality: Right;   TOTAL HIP ARTHROPLASTY Left 01/18/2013   Procedure: TOTAL HIP ARTHROPLASTY ANTERIOR APPROACH;  Surgeon: Hessie Dibble, MD;  Location: [place];  Service: Orthopedics;  Laterality: Left;    OB History   No obstetric history on file.      Home Medications    Prior to Admission medications   Medication Sig Start Date End Date Taking? Authorizing Provider  cetirizine (ZYRTEC) 10 MG tablet Take 1 tablet (10 mg total) by mouth daily. 03/08/22  Yes London Tarnowski, Stasia Cavalier, FNP  ciprofloxacin-dexamethasone (CIPRODEX) OTIC suspension Place 4 drops into the left ear 2 (two) times daily for 7 days. 03/08/22 03/15/22 Yes Belle Charlie, Stasia Cavalier, FNP  fluticasone (FLONASE) 50 MCG/ACT nasal spray Place 1 spray into both nostrils daily. 03/08/22  Yes Talbot Grumbling, FNP  cyclobenzaprine (FLEXERIL) 10 MG tablet Take 1 tablet (10 mg total) by mouth 2 (two) times daily as needed for muscle spasms. 01/02/17   Lysbeth Penner, FNP  folic acid (  FOLVITE) 1 MG tablet Take 1 mg by mouth daily.    [provider]  meloxicam (MOBIC) 15 MG tablet Take 15 mg by mouth daily.    [provider]  Polyethyl Glycol-Propyl Glycol (SYSTANE) 0.4-0.3 % GEL ophthalmic gel Place 1 application into both eyes at bedtime as needed. 04/26/18   Tasia Catchings, Amy V, PA-C  Polyethyl Glycol-Propyl Glycol (SYSTANE) 0.4-0.3 % SOLN Apply 1-2 drops to eye 4 (four) times daily as needed. 04/26/18   Tasia Catchings, Amy V, PA-C  potassium chloride SA (K-DUR,KLOR-CON) 20 MEQ tablet Take 20 mEq by mouth daily. Reported on 11/15/2015    [provider]  triamterene-hydrochlorothiazide (MAXZIDE) 75-50 MG per  tablet Take 1 tablet by mouth daily. Reported on 11/15/2015    [provider]    Family History Family History  Problem Relation Age of Onset   Breast cancer Cousin        in 31's   Breast cancer Cousin        in 90's    Social History Social History   Tobacco Use   Smoking status: Never   Smokeless tobacco: Never  Substance Use Topics   Alcohol use: No   Drug use: No     Allergies   Methotrexate derivatives and Vicodin [hydrocodone-acetaminophen]   Review of Systems Review of Systems Per HPI  Physical Exam Triage Vital Signs ED Triage Vitals  Enc Vitals Group     BP 03/08/22 1210 (!) 198/78     Pulse Rate 03/08/22 1210 64     Resp 03/08/22 1210 17     Temp 03/08/22 1210 97.9 F (36.6 C)     Temp Source 03/08/22 1210 Oral     SpO2 03/08/22 1210 99 %     Weight 03/08/22 1208 143 lb 8.3 oz (65.1 kg)     Height 03/08/22 1208 5\' 5"  (1.651 m)     Head Circumference --      Peak Flow --      Pain Score 03/08/22 1208 0     Pain Loc --      Pain Edu? --      Excl. in [place]? --    No data found.  Updated Vital Signs BP (!) 198/78 (BP Location: Left Arm) Comment: states have not taken BP meds today.  Pulse 64   Temp 97.9 F (36.6 C) (Oral)   Resp 17   Ht 5\' 5"  (1.651 m)   Wt 143 lb 8.3 oz (65.1 kg)   SpO2 99%   BMI 23.88 kg/m   Visual Acuity Right Eye Distance:   Left Eye Distance:   Bilateral Distance:    Right Eye Near:   Left Eye Near:    Bilateral Near:     Physical Exam Vitals and nursing note reviewed.  Constitutional:      General: She is not in acute distress.    Appearance: Normal appearance. She is well-developed. She is not ill-appearing.  HENT:     Head: Normocephalic and atraumatic.     Right Ear: Tympanic membrane, ear canal and external ear normal. There is no impacted cerumen.     Left Ear: Tympanic membrane and external ear normal. Swelling and tenderness present. There is no impacted cerumen.     Ears:     Comments: No  cerumen to bilateral ear canals.  Left ear canal appears erythematous and inflamed.  Hearing is normal to bilateral ears.    Nose: Rhinorrhea present. Rhinorrhea is clear and purulent.  Right Turbinates: Swollen and pale.     Left Turbinates: Swollen and pale.     Mouth/Throat:     Mouth: Mucous membranes are moist.     Pharynx: Posterior oropharyngeal erythema present.     Comments: Mild erythema to posterior oropharynx with small amount of clear postnasal drainage visualized. Airway intact and patent. Eyes:     Extraocular Movements: Extraocular movements intact.     Conjunctiva/sclera: Conjunctivae normal.  Cardiovascular:     Rate and Rhythm: Normal rate and regular rhythm.     Heart sounds: Normal heart sounds. No murmur heard.   No friction rub. No gallop.  Pulmonary:     Effort: Pulmonary effort is normal. No respiratory distress.     Breath sounds: Normal breath sounds. No wheezing, rhonchi or rales.     Comments: Lung sounds clear to auscultation in all lung fields.  Airway is intact.   Chest:     Chest wall: No tenderness.  Abdominal:     Palpations: Abdomen is soft.     Tenderness: There is no abdominal tenderness. There is no right CVA tenderness or left CVA tenderness.  Musculoskeletal:        General: No swelling.     Cervical back: Neck supple.  Skin:    General: Skin is warm and dry.     Capillary Refill: Capillary refill takes less than 2 seconds.     Findings: No rash.  Neurological:     General: No focal deficit present.     Mental Status: She is alert and oriented to person, place, and time.  Psychiatric:        Mood and Affect: Mood normal.        Behavior: Behavior normal.        Thought Content: Thought content normal.        Judgment: Judgment normal.     UC Treatments / Results  Labs (all labs ordered are listed, but only abnormal results are displayed) Labs Reviewed - No data to display  EKG   Radiology No results  found.  Procedures Procedures (including critical care time)  Medications Ordered in UC Medications - No data to display  Initial Impression / Assessment and Plan / UC Course  I have reviewed the triage vital signs and the nursing notes.  Pertinent labs & imaging results that were available during my care of the patient were reviewed by me and considered in my medical decision making (see chart for details).  Patient is a 71 year old female presenting to urgent care today with left sided ear pain that started a few days ago. Left ear canal is erythematous and inflamed.Plan to treat otitis externa with Ciprodex eardrops for the next 5 days.Bilateral tympanic membranes appear normal.  No ear drainage present to physical exam.  Nasal turbinates are very inflamed, erythematous, and pale.  With evidence of clear and purulent nasal congestion.  Plan to treat allergic rhinitis with Flonase nasal spray 1 puff into each nostril daily and 10 mg cetirizine daily.  No watery eye discharge noted to eyes in clinic today, but patient does report subjective history of eye itch.  Patient to take olopatadine eyedrops twice daily as needed 1 drop into each eye for eye itching related to allergic rhinitis.  Blood pressure is elevated in clinic today at 198/78.  Upon recheck, blood pressure is still elevated at 199/77.  Patient has not taken her blood pressure medications this morning, but usually takes them regularly.  She has  a blood pressure cuff at home and monitors her blood pressure consistently.  Patient instructed to go home and take her blood pressure medications then monitor her blood pressure 30 minutes later.  If her blood pressure is still elevated, she is to call her primary care provider for further evaluation and management. If she becomes symptomatic, she is to return to urgent care/ED for urgent evaluation.   Counseled patient regarding appropriate use of medications and potential side effects for  all medications recommended or prescribed today. Discussed red flag signs and symptoms of worsening condition,when to call the PCP office, return to urgent care, and when to seek higher level of care. Patient verbalizes understanding and agreement with plan. All questions answered. Patient discharged in stable condition.  Final Clinical Impressions(s) / UC Diagnoses   Final diagnoses:  Seasonal allergic rhinitis due to pollen  Essential hypertension  Infective otitis externa of left ear     Discharge Instructions      You were seen in urgent care today for allergic rhinitis (seasonal allergies).  Please use Flonase nasal spray 1 puff into each nostril daily to decrease nasal inflammation and nasal congestion.  Use olopatadine eyedrops for eye itch as needed twice daily 1 drop into each eye.  Take cetirizine 10 mg once daily for postnasal drainage and to dry up nasal secretions causing postnasal drainage in your throat.  Place 4 drops of Ciprodex ear drops into your left ear 2 times daily for the next 7 days to treat your middle ear infection.  Do not use this longer than 7 days.  Take your blood pressure medicine when you get home.  If your blood pressure remains elevated after 30 minutes and you have a headache, blurry vision, or decreased visual acuity, please return to urgent care or go to the emergency room for evaluation due to hypertension.  Call your primary care provider on Tuesday after the holiday weekend to schedule an appointment for evaluation of your blood pressure to make sure that your medications are working appropriately for you for adequate blood pressure management.  If you develop any new or worsening symptoms or do not improve in the next 2 to 3 days, please return.  If your symptoms are severe, please go to the emergency room.  Follow-up with your primary care provider for further evaluation and management of your symptoms as well as ongoing wellness visits.  I hope you  feel better!     ED Prescriptions     Medication Sig Dispense Auth. Provider   ciprofloxacin-dexamethasone (CIPRODEX) OTIC suspension Place 4 drops into the left ear 2 (two) times daily for 7 days. 7.5 mL Joella Prince M, FNP   fluticasone [facility]) 50 MCG/ACT nasal spray Place 1 spray into both nostrils daily. 16 g Joella Prince M, FNP   cetirizine (ZYRTEC) 10 MG tablet Take 1 tablet (10 mg total) by mouth daily. 30 tablet Talbot Grumbling, FNP      PDMP not reviewed this encounter.   Talbot Grumbling, [place] 03/08/22 1306

## 2022-03-08 NOTE — ED Triage Notes (Signed)
Pt reports ear wax impaction in left ear x 2/3 weeks. States have tried using the ear wax removal kit with no success.

## 2022-03-08 NOTE — Discharge Instructions (Addendum)
You were seen in urgent care today for allergic rhinitis (seasonal allergies).  Please use Flonase nasal spray 1 puff into each nostril daily to decrease nasal inflammation and nasal congestion.  Use olopatadine eyedrops for eye itch as needed twice daily 1 drop into each eye.  Take cetirizine 10 mg once daily for postnasal drainage and to dry up nasal secretions causing postnasal drainage in your throat.  Place 4 drops of Ciprodex ear drops into your left ear 2 times daily for the next 7 days to treat your middle ear infection.  Do not use this longer than 7 days.  Take your blood pressure medicine when you get home.  If your blood pressure remains elevated after 30 minutes and you have a headache, blurry vision, or decreased visual acuity, please return to urgent care or go to the emergency room for evaluation due to hypertension.  Call your primary care provider on Tuesday after the holiday weekend to schedule an appointment for evaluation of your blood pressure to make sure that your medications are working appropriately for you for adequate blood pressure management.  If you develop any new or worsening symptoms or do not improve in the next 2 to 3 days, please return.  If your symptoms are severe, please go to the emergency room.  Follow-up with your primary care provider for further evaluation and management of your symptoms as well as ongoing wellness visits.  I hope you feel better!

## 2022-03-21 DIAGNOSIS — M069 Rheumatoid arthritis, unspecified: Secondary | ICD-10-CM | POA: Diagnosis not present

## 2022-03-21 DIAGNOSIS — I10 Essential (primary) hypertension: Secondary | ICD-10-CM | POA: Diagnosis not present

## 2022-03-21 DIAGNOSIS — J309 Allergic rhinitis, unspecified: Secondary | ICD-10-CM | POA: Diagnosis not present

## 2022-04-19 ENCOUNTER — Encounter: Payer: Self-pay | Admitting: *Deleted

## 2022-04-19 LAB — GLUCOSE, POCT (MANUAL RESULT ENTRY): POC Glucose: 144 mg/dl — AB (ref 70–99)

## 2022-06-03 DIAGNOSIS — H25813 Combined forms of age-related cataract, bilateral: Secondary | ICD-10-CM | POA: Diagnosis not present

## 2022-06-03 DIAGNOSIS — H5213 Myopia, bilateral: Secondary | ICD-10-CM | POA: Diagnosis not present

## 2022-06-09 ENCOUNTER — Other Ambulatory Visit: Payer: Self-pay | Admitting: Family Medicine

## 2022-06-09 DIAGNOSIS — Z1231 Encounter for screening mammogram for malignant neoplasm of breast: Secondary | ICD-10-CM

## 2022-07-01 ENCOUNTER — Ambulatory Visit: Payer: Medicare Other

## 2022-07-09 DIAGNOSIS — M069 Rheumatoid arthritis, unspecified: Secondary | ICD-10-CM | POA: Diagnosis not present

## 2022-07-09 DIAGNOSIS — S32810D Multiple fractures of pelvis with stable disruption of pelvic ring, subsequent encounter for fracture with routine healing: Secondary | ICD-10-CM | POA: Diagnosis not present

## 2022-07-09 DIAGNOSIS — Z79899 Other long term (current) drug therapy: Secondary | ICD-10-CM | POA: Diagnosis not present

## 2022-07-09 DIAGNOSIS — M2550 Pain in unspecified joint: Secondary | ICD-10-CM | POA: Diagnosis not present

## 2022-07-10 ENCOUNTER — Ambulatory Visit
Admission: RE | Admit: 2022-07-10 | Discharge: 2022-07-10 | Disposition: A | Discharge status: Home / Self Care | Payer: Medicare Other | Source: Ambulatory Visit | Attending: Family Medicine | Admitting: Family Medicine

## 2022-07-10 DIAGNOSIS — Z1231 Encounter for screening mammogram for malignant neoplasm of breast: Secondary | ICD-10-CM

## 2022-07-28 DIAGNOSIS — D61818 Other pancytopenia: Secondary | ICD-10-CM | POA: Diagnosis not present

## 2022-07-28 DIAGNOSIS — G4700 Insomnia, unspecified: Secondary | ICD-10-CM | POA: Diagnosis not present

## 2022-07-28 DIAGNOSIS — Z0000 Encounter for general adult medical examination without abnormal findings: Secondary | ICD-10-CM | POA: Diagnosis not present

## 2022-07-28 DIAGNOSIS — M899 Disorder of bone, unspecified: Secondary | ICD-10-CM | POA: Diagnosis not present

## 2022-07-28 DIAGNOSIS — I10 Essential (primary) hypertension: Secondary | ICD-10-CM | POA: Diagnosis not present

## 2022-07-28 DIAGNOSIS — M069 Rheumatoid arthritis, unspecified: Secondary | ICD-10-CM | POA: Diagnosis not present

## 2022-07-29 ENCOUNTER — Other Ambulatory Visit: Payer: Self-pay | Admitting: Family Medicine

## 2022-07-29 DIAGNOSIS — M8580 Other specified disorders of bone density and structure, unspecified site: Secondary | ICD-10-CM

## 2022-08-15 DIAGNOSIS — N179 Acute kidney failure, unspecified: Secondary | ICD-10-CM | POA: Diagnosis not present

## 2022-08-20 DIAGNOSIS — M1711 Unilateral primary osteoarthritis, right knee: Secondary | ICD-10-CM | POA: Diagnosis not present

## 2022-08-20 DIAGNOSIS — M25561 Pain in right knee: Secondary | ICD-10-CM | POA: Diagnosis not present

## 2022-08-27 ENCOUNTER — Encounter: Payer: Self-pay | Admitting: *Deleted

## 2022-08-27 NOTE — Progress Notes (Signed)
Pt states Dr. Clelia Croft is still her PCP and was seen in June. Pt also follows up with Rheumatology at [facility] and that dr is also following her labs and b/p. Pt declined any additional healthcare support or access needs so no additional 5 -in-5 f/u indicated.

## 2023-01-07 ENCOUNTER — Ambulatory Visit
Admission: RE | Admit: 2023-01-07 | Discharge: 2023-01-07 | Disposition: A | Discharge status: Home / Self Care | Payer: Medicare Other | Source: Ambulatory Visit | Attending: Family Medicine | Admitting: Family Medicine

## 2023-01-07 DIAGNOSIS — M069 Rheumatoid arthritis, unspecified: Secondary | ICD-10-CM | POA: Diagnosis not present

## 2023-01-07 DIAGNOSIS — Z79899 Other long term (current) drug therapy: Secondary | ICD-10-CM | POA: Diagnosis not present

## 2023-01-07 DIAGNOSIS — S32810D Multiple fractures of pelvis with stable disruption of pelvic ring, subsequent encounter for fracture with routine healing: Secondary | ICD-10-CM | POA: Diagnosis not present

## 2023-01-07 DIAGNOSIS — M8580 Other specified disorders of bone density and structure, unspecified site: Secondary | ICD-10-CM

## 2023-01-07 DIAGNOSIS — Z780 Asymptomatic menopausal state: Secondary | ICD-10-CM | POA: Diagnosis not present

## 2023-01-07 DIAGNOSIS — M1991 Primary osteoarthritis, unspecified site: Secondary | ICD-10-CM | POA: Diagnosis not present

## 2023-01-21 DIAGNOSIS — M1711 Unilateral primary osteoarthritis, right knee: Secondary | ICD-10-CM | POA: Diagnosis not present

## 2023-02-11 DIAGNOSIS — H6980 Other specified disorders of Eustachian tube, unspecified ear: Secondary | ICD-10-CM | POA: Diagnosis not present

## 2023-02-11 DIAGNOSIS — I10 Essential (primary) hypertension: Secondary | ICD-10-CM | POA: Diagnosis not present

## 2023-06-17 ENCOUNTER — Other Ambulatory Visit: Payer: Self-pay | Admitting: Family Medicine

## 2023-06-17 DIAGNOSIS — Z0000 Encounter for general adult medical examination without abnormal findings: Secondary | ICD-10-CM

## 2023-07-08 DIAGNOSIS — Z79899 Other long term (current) drug therapy: Secondary | ICD-10-CM | POA: Diagnosis not present

## 2023-07-08 DIAGNOSIS — M069 Rheumatoid arthritis, unspecified: Secondary | ICD-10-CM | POA: Diagnosis not present

## 2023-07-08 DIAGNOSIS — M1991 Primary osteoarthritis, unspecified site: Secondary | ICD-10-CM | POA: Diagnosis not present

## 2023-07-13 ENCOUNTER — Ambulatory Visit: Payer: Medicare Other

## 2023-07-29 DIAGNOSIS — M25461 Effusion, right knee: Secondary | ICD-10-CM | POA: Diagnosis not present

## 2023-07-30 DIAGNOSIS — M069 Rheumatoid arthritis, unspecified: Secondary | ICD-10-CM | POA: Diagnosis not present

## 2023-07-30 DIAGNOSIS — N1831 Chronic kidney disease, stage 3a: Secondary | ICD-10-CM | POA: Diagnosis not present

## 2023-07-30 DIAGNOSIS — I10 Essential (primary) hypertension: Secondary | ICD-10-CM | POA: Diagnosis not present

## 2023-07-30 DIAGNOSIS — Z0000 Encounter for general adult medical examination without abnormal findings: Secondary | ICD-10-CM | POA: Diagnosis not present

## 2023-07-30 DIAGNOSIS — G4700 Insomnia, unspecified: Secondary | ICD-10-CM | POA: Diagnosis not present

## 2023-07-30 DIAGNOSIS — M899 Disorder of bone, unspecified: Secondary | ICD-10-CM | POA: Diagnosis not present

## 2023-07-30 DIAGNOSIS — Z23 Encounter for immunization: Secondary | ICD-10-CM | POA: Diagnosis not present

## 2023-08-17 ENCOUNTER — Ambulatory Visit: Payer: Medicare Other

## 2023-08-18 ENCOUNTER — Ambulatory Visit
Admission: RE | Admit: 2023-08-18 | Discharge: 2023-08-18 | Disposition: A | Discharge status: Home / Self Care | Payer: Medicare Other | Source: Ambulatory Visit | Attending: Family Medicine | Admitting: Family Medicine

## 2023-08-18 DIAGNOSIS — Z1231 Encounter for screening mammogram for malignant neoplasm of breast: Secondary | ICD-10-CM | POA: Diagnosis not present

## 2023-08-18 DIAGNOSIS — Z0000 Encounter for general adult medical examination without abnormal findings: Secondary | ICD-10-CM

## 2023-09-16 DIAGNOSIS — M1711 Unilateral primary osteoarthritis, right knee: Secondary | ICD-10-CM | POA: Diagnosis not present

## 2023-10-19 DIAGNOSIS — M1711 Unilateral primary osteoarthritis, right knee: Secondary | ICD-10-CM | POA: Diagnosis not present

## 2023-11-23 DIAGNOSIS — M25661 Stiffness of right knee, not elsewhere classified: Secondary | ICD-10-CM | POA: Diagnosis not present

## 2023-11-23 DIAGNOSIS — M1731 Unilateral post-traumatic osteoarthritis, right knee: Secondary | ICD-10-CM | POA: Diagnosis not present

## 2023-11-23 DIAGNOSIS — R262 Difficulty in walking, not elsewhere classified: Secondary | ICD-10-CM | POA: Diagnosis not present

## 2023-11-30 DIAGNOSIS — M1711 Unilateral primary osteoarthritis, right knee: Secondary | ICD-10-CM | POA: Diagnosis not present

## 2023-11-30 DIAGNOSIS — M25561 Pain in right knee: Secondary | ICD-10-CM | POA: Diagnosis not present

## 2023-12-01 DIAGNOSIS — M25661 Stiffness of right knee, not elsewhere classified: Secondary | ICD-10-CM | POA: Diagnosis not present

## 2023-12-01 DIAGNOSIS — R262 Difficulty in walking, not elsewhere classified: Secondary | ICD-10-CM | POA: Diagnosis not present

## 2023-12-01 DIAGNOSIS — M1731 Unilateral post-traumatic osteoarthritis, right knee: Secondary | ICD-10-CM | POA: Diagnosis not present

## 2023-12-08 DIAGNOSIS — R262 Difficulty in walking, not elsewhere classified: Secondary | ICD-10-CM | POA: Diagnosis not present

## 2023-12-08 DIAGNOSIS — M25661 Stiffness of right knee, not elsewhere classified: Secondary | ICD-10-CM | POA: Diagnosis not present

## 2023-12-08 DIAGNOSIS — M1731 Unilateral post-traumatic osteoarthritis, right knee: Secondary | ICD-10-CM | POA: Diagnosis not present

## 2023-12-15 DIAGNOSIS — M1731 Unilateral post-traumatic osteoarthritis, right knee: Secondary | ICD-10-CM | POA: Diagnosis not present

## 2023-12-15 DIAGNOSIS — M25661 Stiffness of right knee, not elsewhere classified: Secondary | ICD-10-CM | POA: Diagnosis not present

## 2023-12-15 DIAGNOSIS — R262 Difficulty in walking, not elsewhere classified: Secondary | ICD-10-CM | POA: Diagnosis not present

## 2023-12-17 DIAGNOSIS — M1711 Unilateral primary osteoarthritis, right knee: Secondary | ICD-10-CM | POA: Diagnosis not present

## 2023-12-17 DIAGNOSIS — G8918 Other acute postprocedural pain: Secondary | ICD-10-CM | POA: Diagnosis not present

## 2023-12-17 DIAGNOSIS — Z96651 Presence of right artificial knee joint: Secondary | ICD-10-CM | POA: Diagnosis not present

## 2023-12-21 DIAGNOSIS — Z96651 Presence of right artificial knee joint: Secondary | ICD-10-CM | POA: Diagnosis not present

## 2023-12-21 DIAGNOSIS — M25661 Stiffness of right knee, not elsewhere classified: Secondary | ICD-10-CM | POA: Diagnosis not present

## 2023-12-21 DIAGNOSIS — R262 Difficulty in walking, not elsewhere classified: Secondary | ICD-10-CM | POA: Diagnosis not present

## 2023-12-21 DIAGNOSIS — R531 Weakness: Secondary | ICD-10-CM | POA: Diagnosis not present

## 2023-12-24 DIAGNOSIS — M25661 Stiffness of right knee, not elsewhere classified: Secondary | ICD-10-CM | POA: Diagnosis not present

## 2023-12-24 DIAGNOSIS — R531 Weakness: Secondary | ICD-10-CM | POA: Diagnosis not present

## 2023-12-24 DIAGNOSIS — R262 Difficulty in walking, not elsewhere classified: Secondary | ICD-10-CM | POA: Diagnosis not present

## 2023-12-24 DIAGNOSIS — Z96651 Presence of right artificial knee joint: Secondary | ICD-10-CM | POA: Diagnosis not present

## 2023-12-28 DIAGNOSIS — M1711 Unilateral primary osteoarthritis, right knee: Secondary | ICD-10-CM | POA: Diagnosis not present

## 2023-12-29 DIAGNOSIS — R262 Difficulty in walking, not elsewhere classified: Secondary | ICD-10-CM | POA: Diagnosis not present

## 2023-12-29 DIAGNOSIS — Z96651 Presence of right artificial knee joint: Secondary | ICD-10-CM | POA: Diagnosis not present

## 2023-12-29 DIAGNOSIS — M25661 Stiffness of right knee, not elsewhere classified: Secondary | ICD-10-CM | POA: Diagnosis not present

## 2023-12-29 DIAGNOSIS — R531 Weakness: Secondary | ICD-10-CM | POA: Diagnosis not present

## 2023-12-31 DIAGNOSIS — R531 Weakness: Secondary | ICD-10-CM | POA: Diagnosis not present

## 2023-12-31 DIAGNOSIS — Z96651 Presence of right artificial knee joint: Secondary | ICD-10-CM | POA: Diagnosis not present

## 2023-12-31 DIAGNOSIS — R262 Difficulty in walking, not elsewhere classified: Secondary | ICD-10-CM | POA: Diagnosis not present

## 2023-12-31 DIAGNOSIS — M25661 Stiffness of right knee, not elsewhere classified: Secondary | ICD-10-CM | POA: Diagnosis not present

## 2024-01-05 DIAGNOSIS — R531 Weakness: Secondary | ICD-10-CM | POA: Diagnosis not present

## 2024-01-05 DIAGNOSIS — M25661 Stiffness of right knee, not elsewhere classified: Secondary | ICD-10-CM | POA: Diagnosis not present

## 2024-01-05 DIAGNOSIS — R262 Difficulty in walking, not elsewhere classified: Secondary | ICD-10-CM | POA: Diagnosis not present

## 2024-01-05 DIAGNOSIS — Z96651 Presence of right artificial knee joint: Secondary | ICD-10-CM | POA: Diagnosis not present

## 2024-01-06 DIAGNOSIS — S32810D Multiple fractures of pelvis with stable disruption of pelvic ring, subsequent encounter for fracture with routine healing: Secondary | ICD-10-CM | POA: Diagnosis not present

## 2024-01-06 DIAGNOSIS — Z6822 Body mass index (BMI) 22.0-22.9, adult: Secondary | ICD-10-CM | POA: Diagnosis not present

## 2024-01-06 DIAGNOSIS — Z79899 Other long term (current) drug therapy: Secondary | ICD-10-CM | POA: Diagnosis not present

## 2024-01-06 DIAGNOSIS — M1991 Primary osteoarthritis, unspecified site: Secondary | ICD-10-CM | POA: Diagnosis not present

## 2024-01-06 DIAGNOSIS — M069 Rheumatoid arthritis, unspecified: Secondary | ICD-10-CM | POA: Diagnosis not present

## 2024-01-07 DIAGNOSIS — Z96651 Presence of right artificial knee joint: Secondary | ICD-10-CM | POA: Diagnosis not present

## 2024-01-07 DIAGNOSIS — R262 Difficulty in walking, not elsewhere classified: Secondary | ICD-10-CM | POA: Diagnosis not present

## 2024-01-07 DIAGNOSIS — R531 Weakness: Secondary | ICD-10-CM | POA: Diagnosis not present

## 2024-01-07 DIAGNOSIS — M25661 Stiffness of right knee, not elsewhere classified: Secondary | ICD-10-CM | POA: Diagnosis not present

## 2024-01-12 DIAGNOSIS — R531 Weakness: Secondary | ICD-10-CM | POA: Diagnosis not present

## 2024-01-12 DIAGNOSIS — M25661 Stiffness of right knee, not elsewhere classified: Secondary | ICD-10-CM | POA: Diagnosis not present

## 2024-01-12 DIAGNOSIS — Z96651 Presence of right artificial knee joint: Secondary | ICD-10-CM | POA: Diagnosis not present

## 2024-01-12 DIAGNOSIS — R262 Difficulty in walking, not elsewhere classified: Secondary | ICD-10-CM | POA: Diagnosis not present

## 2024-01-14 DIAGNOSIS — R531 Weakness: Secondary | ICD-10-CM | POA: Diagnosis not present

## 2024-01-14 DIAGNOSIS — M25661 Stiffness of right knee, not elsewhere classified: Secondary | ICD-10-CM | POA: Diagnosis not present

## 2024-01-14 DIAGNOSIS — Z96651 Presence of right artificial knee joint: Secondary | ICD-10-CM | POA: Diagnosis not present

## 2024-01-14 DIAGNOSIS — R262 Difficulty in walking, not elsewhere classified: Secondary | ICD-10-CM | POA: Diagnosis not present

## 2024-01-26 DIAGNOSIS — R531 Weakness: Secondary | ICD-10-CM | POA: Diagnosis not present

## 2024-01-26 DIAGNOSIS — M25661 Stiffness of right knee, not elsewhere classified: Secondary | ICD-10-CM | POA: Diagnosis not present

## 2024-01-26 DIAGNOSIS — R262 Difficulty in walking, not elsewhere classified: Secondary | ICD-10-CM | POA: Diagnosis not present

## 2024-01-26 DIAGNOSIS — Z96651 Presence of right artificial knee joint: Secondary | ICD-10-CM | POA: Diagnosis not present

## 2024-01-27 DIAGNOSIS — Z96651 Presence of right artificial knee joint: Secondary | ICD-10-CM | POA: Diagnosis not present

## 2024-01-27 DIAGNOSIS — M25661 Stiffness of right knee, not elsewhere classified: Secondary | ICD-10-CM | POA: Diagnosis not present

## 2024-01-27 DIAGNOSIS — R262 Difficulty in walking, not elsewhere classified: Secondary | ICD-10-CM | POA: Diagnosis not present

## 2024-01-27 DIAGNOSIS — R531 Weakness: Secondary | ICD-10-CM | POA: Diagnosis not present

## 2024-02-01 DIAGNOSIS — R531 Weakness: Secondary | ICD-10-CM | POA: Diagnosis not present

## 2024-02-01 DIAGNOSIS — Z96651 Presence of right artificial knee joint: Secondary | ICD-10-CM | POA: Diagnosis not present

## 2024-02-01 DIAGNOSIS — R262 Difficulty in walking, not elsewhere classified: Secondary | ICD-10-CM | POA: Diagnosis not present

## 2024-02-01 DIAGNOSIS — M25661 Stiffness of right knee, not elsewhere classified: Secondary | ICD-10-CM | POA: Diagnosis not present

## 2024-02-03 DIAGNOSIS — R262 Difficulty in walking, not elsewhere classified: Secondary | ICD-10-CM | POA: Diagnosis not present

## 2024-02-03 DIAGNOSIS — Z96651 Presence of right artificial knee joint: Secondary | ICD-10-CM | POA: Diagnosis not present

## 2024-02-03 DIAGNOSIS — R531 Weakness: Secondary | ICD-10-CM | POA: Diagnosis not present

## 2024-02-03 DIAGNOSIS — M25661 Stiffness of right knee, not elsewhere classified: Secondary | ICD-10-CM | POA: Diagnosis not present

## 2024-02-09 DIAGNOSIS — R531 Weakness: Secondary | ICD-10-CM | POA: Diagnosis not present

## 2024-02-09 DIAGNOSIS — Z96651 Presence of right artificial knee joint: Secondary | ICD-10-CM | POA: Diagnosis not present

## 2024-02-09 DIAGNOSIS — M25661 Stiffness of right knee, not elsewhere classified: Secondary | ICD-10-CM | POA: Diagnosis not present

## 2024-02-09 DIAGNOSIS — R262 Difficulty in walking, not elsewhere classified: Secondary | ICD-10-CM | POA: Diagnosis not present

## 2024-02-11 DIAGNOSIS — Z96651 Presence of right artificial knee joint: Secondary | ICD-10-CM | POA: Diagnosis not present

## 2024-02-11 DIAGNOSIS — M25661 Stiffness of right knee, not elsewhere classified: Secondary | ICD-10-CM | POA: Diagnosis not present

## 2024-02-11 DIAGNOSIS — R531 Weakness: Secondary | ICD-10-CM | POA: Diagnosis not present

## 2024-02-11 DIAGNOSIS — R262 Difficulty in walking, not elsewhere classified: Secondary | ICD-10-CM | POA: Diagnosis not present

## 2024-02-16 DIAGNOSIS — M25661 Stiffness of right knee, not elsewhere classified: Secondary | ICD-10-CM | POA: Diagnosis not present

## 2024-02-16 DIAGNOSIS — Z96651 Presence of right artificial knee joint: Secondary | ICD-10-CM | POA: Diagnosis not present

## 2024-02-16 DIAGNOSIS — R531 Weakness: Secondary | ICD-10-CM | POA: Diagnosis not present

## 2024-02-16 DIAGNOSIS — R262 Difficulty in walking, not elsewhere classified: Secondary | ICD-10-CM | POA: Diagnosis not present

## 2024-02-29 DIAGNOSIS — H2513 Age-related nuclear cataract, bilateral: Secondary | ICD-10-CM | POA: Diagnosis not present

## 2024-02-29 DIAGNOSIS — Z79899 Other long term (current) drug therapy: Secondary | ICD-10-CM | POA: Diagnosis not present

## 2024-03-12 DIAGNOSIS — N1831 Chronic kidney disease, stage 3a: Secondary | ICD-10-CM | POA: Diagnosis not present

## 2024-03-12 DIAGNOSIS — M069 Rheumatoid arthritis, unspecified: Secondary | ICD-10-CM | POA: Diagnosis not present

## 2024-03-12 DIAGNOSIS — I10 Essential (primary) hypertension: Secondary | ICD-10-CM | POA: Diagnosis not present

## 2024-03-21 DIAGNOSIS — M25661 Stiffness of right knee, not elsewhere classified: Secondary | ICD-10-CM | POA: Diagnosis not present

## 2024-04-11 DIAGNOSIS — I10 Essential (primary) hypertension: Secondary | ICD-10-CM | POA: Diagnosis not present

## 2024-04-11 DIAGNOSIS — M069 Rheumatoid arthritis, unspecified: Secondary | ICD-10-CM | POA: Diagnosis not present

## 2024-04-11 DIAGNOSIS — N1831 Chronic kidney disease, stage 3a: Secondary | ICD-10-CM | POA: Diagnosis not present

## 2024-05-04 DIAGNOSIS — R5383 Other fatigue: Secondary | ICD-10-CM | POA: Diagnosis not present

## 2024-05-04 DIAGNOSIS — U071 COVID-19: Secondary | ICD-10-CM | POA: Diagnosis not present

## 2024-05-12 DIAGNOSIS — N1831 Chronic kidney disease, stage 3a: Secondary | ICD-10-CM | POA: Diagnosis not present

## 2024-05-12 DIAGNOSIS — M069 Rheumatoid arthritis, unspecified: Secondary | ICD-10-CM | POA: Diagnosis not present

## 2024-05-12 DIAGNOSIS — I10 Essential (primary) hypertension: Secondary | ICD-10-CM | POA: Diagnosis not present

## 2024-06-12 DIAGNOSIS — N1831 Chronic kidney disease, stage 3a: Secondary | ICD-10-CM | POA: Diagnosis not present

## 2024-06-12 DIAGNOSIS — M069 Rheumatoid arthritis, unspecified: Secondary | ICD-10-CM | POA: Diagnosis not present

## 2024-06-12 DIAGNOSIS — I10 Essential (primary) hypertension: Secondary | ICD-10-CM | POA: Diagnosis not present

## 2024-07-07 DIAGNOSIS — Z79899 Other long term (current) drug therapy: Secondary | ICD-10-CM | POA: Diagnosis not present

## 2024-07-07 DIAGNOSIS — M1991 Primary osteoarthritis, unspecified site: Secondary | ICD-10-CM | POA: Diagnosis not present

## 2024-07-07 DIAGNOSIS — M069 Rheumatoid arthritis, unspecified: Secondary | ICD-10-CM | POA: Diagnosis not present

## 2024-07-12 DIAGNOSIS — N1831 Chronic kidney disease, stage 3a: Secondary | ICD-10-CM | POA: Diagnosis not present

## 2024-07-12 DIAGNOSIS — M069 Rheumatoid arthritis, unspecified: Secondary | ICD-10-CM | POA: Diagnosis not present

## 2024-07-12 DIAGNOSIS — I10 Essential (primary) hypertension: Secondary | ICD-10-CM | POA: Diagnosis not present

## 2024-07-26 ENCOUNTER — Other Ambulatory Visit: Payer: Self-pay | Admitting: Family Medicine

## 2024-07-26 DIAGNOSIS — Z1231 Encounter for screening mammogram for malignant neoplasm of breast: Secondary | ICD-10-CM

## 2024-07-29 DIAGNOSIS — Z0000 Encounter for general adult medical examination without abnormal findings: Secondary | ICD-10-CM | POA: Diagnosis not present

## 2024-07-29 DIAGNOSIS — Z131 Encounter for screening for diabetes mellitus: Secondary | ICD-10-CM | POA: Diagnosis not present

## 2024-07-29 DIAGNOSIS — M899 Disorder of bone, unspecified: Secondary | ICD-10-CM | POA: Diagnosis not present

## 2024-07-29 DIAGNOSIS — M069 Rheumatoid arthritis, unspecified: Secondary | ICD-10-CM | POA: Diagnosis not present

## 2024-07-29 DIAGNOSIS — G4700 Insomnia, unspecified: Secondary | ICD-10-CM | POA: Diagnosis not present

## 2024-07-29 DIAGNOSIS — N1831 Chronic kidney disease, stage 3a: Secondary | ICD-10-CM | POA: Diagnosis not present

## 2024-07-29 DIAGNOSIS — I10 Essential (primary) hypertension: Secondary | ICD-10-CM | POA: Diagnosis not present

## 2024-07-29 DIAGNOSIS — L309 Dermatitis, unspecified: Secondary | ICD-10-CM | POA: Diagnosis not present

## 2024-07-29 DIAGNOSIS — Z1211 Encounter for screening for malignant neoplasm of colon: Secondary | ICD-10-CM | POA: Diagnosis not present

## 2024-08-18 ENCOUNTER — Ambulatory Visit

## 2024-09-13 ENCOUNTER — Ambulatory Visit
Admission: RE | Admit: 2024-09-13 | Discharge: 2024-09-13 | Disposition: A | Source: Ambulatory Visit | Attending: Family Medicine | Admitting: Family Medicine

## 2024-09-13 DIAGNOSIS — Z1231 Encounter for screening mammogram for malignant neoplasm of breast: Secondary | ICD-10-CM
